# Patient Record
Sex: Male | Born: 1965
Health system: Southern US, Community
[De-identification: ages and names within clinical notes are randomized; demographics above are authoritative.]

## PROBLEM LIST (undated history)

## (undated) DIAGNOSIS — J029 Acute pharyngitis, unspecified: Secondary | ICD-10-CM

## (undated) DIAGNOSIS — M1990 Unspecified osteoarthritis, unspecified site: Secondary | ICD-10-CM

## (undated) DIAGNOSIS — K219 Gastro-esophageal reflux disease without esophagitis: Secondary | ICD-10-CM

## (undated) HISTORY — PX: NM PET  LYMPHOMA INITIAL: HXRAD81

## (undated) HISTORY — DX: Unspecified osteoarthritis, unspecified site: M19.90

---

## 2009-04-09 ENCOUNTER — Emergency Department (HOSPITAL_BASED_OUTPATIENT_CLINIC_OR_DEPARTMENT_OTHER): Admission: EM | Admit: 2009-04-09 | Discharge: 2009-04-09 | Payer: Self-pay | Admitting: Emergency Medicine

## 2011-11-15 ENCOUNTER — Encounter (HOSPITAL_BASED_OUTPATIENT_CLINIC_OR_DEPARTMENT_OTHER): Payer: Self-pay | Admitting: Emergency Medicine

## 2011-11-15 ENCOUNTER — Other Ambulatory Visit: Payer: Self-pay

## 2011-11-15 ENCOUNTER — Emergency Department (HOSPITAL_BASED_OUTPATIENT_CLINIC_OR_DEPARTMENT_OTHER)
Admission: EM | Admit: 2011-11-15 | Discharge: 2011-11-15 | Disposition: A | Discharge status: Home / Self Care | Payer: BC Managed Care – PPO | Attending: Emergency Medicine | Admitting: Emergency Medicine

## 2011-11-15 DIAGNOSIS — K219 Gastro-esophageal reflux disease without esophagitis: Secondary | ICD-10-CM | POA: Insufficient documentation

## 2011-11-15 DIAGNOSIS — F17200 Nicotine dependence, unspecified, uncomplicated: Secondary | ICD-10-CM | POA: Insufficient documentation

## 2011-11-15 DIAGNOSIS — G8929 Other chronic pain: Secondary | ICD-10-CM | POA: Insufficient documentation

## 2011-11-15 DIAGNOSIS — R109 Unspecified abdominal pain: Secondary | ICD-10-CM | POA: Insufficient documentation

## 2011-11-15 HISTORY — DX: Gastro-esophageal reflux disease without esophagitis: K21.9

## 2011-11-15 LAB — CBC
HCT: 38.5 % — ABNORMAL LOW (ref 39.0–52.0)
Platelets: 193 10*3/uL (ref 150–400)
RDW: 12.5 % (ref 11.5–15.5)
WBC: 9.3 10*3/uL (ref 4.0–10.5)

## 2011-11-15 LAB — TROPONIN I: Troponin I: <0.3 ng/mL (ref <0.30)

## 2011-11-15 MED ORDER — ESOMEPRAZOLE MAGNESIUM 40 MG PO CPDR: 40.0000 mg | DELAYED_RELEASE_CAPSULE | Freq: Every day | ORAL | Status: DC

## 2011-11-15 NOTE — ED Provider Notes (Addendum)
History     CSN: [id number]  Arrival date & time 11/15/11  2136   First MD Initiated Contact with Patient 11/15/11 2203      Chief Complaint  Patient presents with  . Abdominal Pain    (Consider location/radiation/quality/duration/timing/severity/associated sxs/prior treatment) HPI Complains of epigastric pain for 8 months, nonradiating, burning in quality, presently is mild is a 2 on a scale of 1-10, worse with eating, or drinking beer, or using spicy sauces. Presents today As has had pain all day and states he feels "tired for the past several days". No shortness of breath had no other associated symptoms no chest pain saw his primary care doctor several months ago treated with Nexium with relief of symptoms after one month switched to Zantac when he ran out of Nexium. Reports Zantac worked at first however no longer works well. Last bowel movement today normal. Patient's primary care doctor has advised him that he should receive endoscopy. Patient declines until now Past Medical History  Diagnosis Date  . GERD (gastroesophageal reflux disease)     History reviewed. No pertinent past surgical history.  No family history on file.  History  Substance Use Topics  . Smoking status: Current Everyday Smoker  . Smokeless tobacco: Not on file  . Alcohol Use: Yes      Review of Systems  Constitutional: Negative.   HENT: Negative.   Respiratory: Negative.   Cardiovascular: Negative.   Gastrointestinal: Positive for abdominal pain.  Musculoskeletal: Negative.   Skin: Negative.   Neurological: Negative.   Hematological: Negative.   Psychiatric/Behavioral: Negative.     Allergies  Review of patient's allergies indicates no known allergies.  Home Medications  No current outpatient prescriptions on file.  BP 136/87  Pulse 77  Temp(Src) 98 F (36.7 C) (Oral)  Resp 18  Ht 5\' 11"  (1.803 m)  Wt 214 lb (97.07 kg)  BMI 29.85 kg/m2  SpO2 99%  Physical Exam  Nursing note  and vitals reviewed. Abdominal: Bowel sounds are normal. He exhibits no distension and no mass. There is tenderness. There is no rebound and no guarding.       Tenderness at epigastrium  Genitourinary: Penis normal.    ED Course  Procedures (including critical care time)  Labs Reviewed - No data to display No results found.  Date: 11/15/2011  Rate: 65  Rhythm: normal sinus rhythm  QRS Axis: normal  Intervals: normal  ST/T Wave abnormalities: normal  Conduction Disutrbances:none  Narrative Interpretation:   Old EKG Reviewed: none available   No diagnosis found. Results for orders placed during the hospital encounter of 11/15/11  TROPONIN I      Component Value Range   Troponin I <0.30  <0.30 (ng/mL)  CBC      Component Value Range   WBC 9.3  4.0 - 10.5 (K/uL)   RBC 4.59  4.22 - 5.81 (MIL/uL)   Hemoglobin 14.2  13.0 - 17.0 (g/dL)   HCT 40.9 (*) 81.1 - 52.0 (%)   MCV 83.9  78.0 - 100.0 (fL)   MCH 30.9  26.0 - 34.0 (pg)   MCHC 36.9 (*) 30.0 - 36.0 (g/dL)   RDW 91.4  78.2 - 95.6 (%)   Platelets 193  150 - 400 (K/uL)   No results found.    MDM  Doubt cardiac etiology of symptoms ,. Lengthy discussion with patient that he should go back to his primary care Dr. for GI referral plan prescription for Nexium patient encouraged to stop alcohol  and smoking Diagnosis chronic abdominal pain        Doug Sou, MD 11/15/11 2325  Doug Sou, MD 12/06/11 1700

## 2011-11-15 NOTE — ED Notes (Signed)
Pt reports he received rx for nexium a few months ago and it initially helped. Pt has been taking zantac but "I'm tired of taking the pills"

## 2011-11-15 NOTE — ED Notes (Signed)
Pt c/o epigastric pain x 8 months. Denies n/v/d.

## 2013-02-07 ENCOUNTER — Encounter (HOSPITAL_BASED_OUTPATIENT_CLINIC_OR_DEPARTMENT_OTHER): Payer: Self-pay | Admitting: *Deleted

## 2013-02-07 ENCOUNTER — Emergency Department (HOSPITAL_BASED_OUTPATIENT_CLINIC_OR_DEPARTMENT_OTHER)
Admission: EM | Admit: 2013-02-07 | Discharge: 2013-02-07 | Disposition: A | Discharge status: Home / Self Care | Payer: BC Managed Care – PPO | Attending: Emergency Medicine | Admitting: Emergency Medicine

## 2013-02-07 DIAGNOSIS — K219 Gastro-esophageal reflux disease without esophagitis: Secondary | ICD-10-CM | POA: Insufficient documentation

## 2013-02-07 DIAGNOSIS — Y92410 Unspecified street and highway as the place of occurrence of the external cause: Secondary | ICD-10-CM | POA: Insufficient documentation

## 2013-02-07 DIAGNOSIS — S2095XA Superficial foreign body of unspecified parts of thorax, initial encounter: Secondary | ICD-10-CM | POA: Insufficient documentation

## 2013-02-07 DIAGNOSIS — R599 Enlarged lymph nodes, unspecified: Secondary | ICD-10-CM | POA: Insufficient documentation

## 2013-02-07 DIAGNOSIS — Y939 Activity, unspecified: Secondary | ICD-10-CM | POA: Insufficient documentation

## 2013-02-07 DIAGNOSIS — F17200 Nicotine dependence, unspecified, uncomplicated: Secondary | ICD-10-CM | POA: Insufficient documentation

## 2013-02-07 DIAGNOSIS — W57XXXA Bitten or stung by nonvenomous insect and other nonvenomous arthropods, initial encounter: Secondary | ICD-10-CM

## 2013-02-07 DIAGNOSIS — Z79899 Other long term (current) drug therapy: Secondary | ICD-10-CM | POA: Insufficient documentation

## 2013-02-07 MED ORDER — DOXYCYCLINE HYCLATE 100 MG PO CAPS: 100.0000 mg | ORAL_CAPSULE | Freq: Two times a day (BID) | ORAL | Status: DC

## 2013-02-07 NOTE — ED Provider Notes (Signed)
History     CSN: [id number]  Arrival date & time 02/07/13  1635   First MD Initiated Contact with Patient 02/07/13 1721      Chief Complaint  Patient presents with  . Fatigue    (Consider location/radiation/quality/duration/timing/severity/associated sxs/prior treatment) HPI  Patient with tick bite to right flank last week. He has noted some swelling in the groin and is sleepy. He has not had any fever or obvious rash. He denies any chest pain, cough, nausea, vomiting, or diarrhea. He has no previous medical problems.  Past Medical History  Diagnosis Date  . GERD (gastroesophageal reflux disease)     History reviewed. No pertinent past surgical history.  No family history on file.  History  Substance Use Topics  . Smoking status: Current Every Day Smoker -- 0.50 packs/day    Types: Cigarettes  . Smokeless tobacco: Not on file  . Alcohol Use: Yes      Review of Systems  All other systems reviewed and are negative.    Allergies  Review of patient's allergies indicates no known allergies.  Home Medications   Current Outpatient Rx  Name  Route  Sig  Dispense  Refill  . EXPIRED: esomeprazole (NEXIUM) 40 MG capsule   Oral   Take 1 capsule (40 mg total) by mouth daily.   30 capsule   0     BP 133/80  Pulse 87  Temp(Src) 98.8 F (37.1 C) (Oral)  Resp 18  Ht 5\' 11"  (1.803 m)  Wt 210 lb (95.255 kg)  BMI 29.3 kg/m2  SpO2 100%  Physical Exam  Nursing note and vitals reviewed. Constitutional: He is oriented to person, place, and time. He appears well-developed and well-nourished.  HENT:  Head: Normocephalic and atraumatic.  Right Ear: External ear normal.  Left Ear: External ear normal.  Nose: Nose normal.  Mouth/Throat: Oropharynx is clear and moist.  Eyes: Conjunctivae are normal. Pupils are equal, round, and reactive to light.  Neck: Normal range of motion. Neck supple.  Cardiovascular: Normal rate, regular rhythm, normal heart sounds and intact  distal pulses.   Pulmonary/Chest: Effort normal and breath sounds normal.  Abdominal: Soft. Bowel sounds are normal.  Musculoskeletal:  Right pelvic adenopathy palpated. Mildly tender.  Neurological: He is alert and oriented to person, place, and time. He has normal reflexes.  Skin: Skin is warm and dry. No rash noted.  Psychiatric: He has a normal mood and affect. His behavior is normal. Judgment and thought content normal.    ED Course  Procedures (including critical care time)  Labs Reviewed - No data to display No results found.   1. Tick bite   2. Swelling, lymph nodes       MDM      As the diagnosis of RMSF can rarely be confirmed or disproved in its early phases, the cornerstone of management is empiric therapy based upon clinical judgment and the epidemiologic setting.    Hilario Quarry, MD 02/07/13 [phone]

## 2013-02-07 NOTE — ED Notes (Signed)
Fatigue, aching in his joints. Removed 2 ticks from his back last week.

## 2013-02-07 NOTE — ED Notes (Signed)
Pt. Drove self here.

## 2013-02-07 NOTE — ED Notes (Signed)
Care assumed

## 2015-09-22 ENCOUNTER — Encounter (HOSPITAL_BASED_OUTPATIENT_CLINIC_OR_DEPARTMENT_OTHER): Payer: Self-pay | Admitting: Emergency Medicine

## 2015-09-22 ENCOUNTER — Emergency Department (HOSPITAL_BASED_OUTPATIENT_CLINIC_OR_DEPARTMENT_OTHER)
Admission: EM | Admit: 2015-09-22 | Discharge: 2015-09-22 | Disposition: A | Discharge status: Home / Self Care | Payer: 59 | Attending: Emergency Medicine | Admitting: Emergency Medicine

## 2015-09-22 DIAGNOSIS — K219 Gastro-esophageal reflux disease without esophagitis: Secondary | ICD-10-CM | POA: Diagnosis not present

## 2015-09-22 DIAGNOSIS — R51 Headache: Secondary | ICD-10-CM | POA: Insufficient documentation

## 2015-09-22 DIAGNOSIS — R070 Pain in throat: Secondary | ICD-10-CM | POA: Diagnosis not present

## 2015-09-22 DIAGNOSIS — Z87891 Personal history of nicotine dependence: Secondary | ICD-10-CM | POA: Insufficient documentation

## 2015-09-22 DIAGNOSIS — M542 Cervicalgia: Secondary | ICD-10-CM | POA: Insufficient documentation

## 2015-09-22 DIAGNOSIS — Z792 Long term (current) use of antibiotics: Secondary | ICD-10-CM | POA: Insufficient documentation

## 2015-09-22 DIAGNOSIS — J029 Acute pharyngitis, unspecified: Secondary | ICD-10-CM | POA: Diagnosis present

## 2015-09-22 HISTORY — DX: Acute pharyngitis, unspecified: J02.9

## 2015-09-22 MED ORDER — NAPROXEN 500 MG PO TABS: 500.0000 mg | ORAL_TABLET | Freq: Two times a day (BID) | ORAL | Status: DC

## 2015-09-22 MED ORDER — PENICILLIN V POTASSIUM 500 MG PO TABS: 500.0000 mg | ORAL_TABLET | Freq: Four times a day (QID) | ORAL | Status: DC

## 2015-09-22 NOTE — ED Notes (Signed)
Pt has had recurring throat problems for over a year.  Pain is in throat and also involves left ear and left side of face. Has recently taken amoxicillin and states he was told that if it did not go away he would need a shot.  Sts he also has an abscessed tooth on left.

## 2015-09-22 NOTE — ED Provider Notes (Signed)
CSN: [id number]     Arrival date & time 09/22/15  1557 History   First MD Initiated Contact with Patient 09/22/15 1606     Chief Complaint  Patient presents with  . Sore Throat     (Consider location/radiation/quality/duration/timing/severity/associated sxs/prior Treatment) Patient is a 49 y.o. male presenting with pharyngitis. The history is provided by the patient.  Sore Throat Associated symptoms include headaches. Pertinent negatives include no chest pain, no abdominal pain and no shortness of breath.   patient with several week history of left-sided throat pain. Evaluated by his dentist your nose and throat and primary care provider. Primary care provider did a rapid strep that was negative started him on amoxicillin was some improvement but not complete resolution. Now things are getting worse again. Also had thorough evaluation for concerns for throat cancer by ear nose and throat Dr. Constance Holster workup was negative other than some evidence of reflux disease. Today patient with increased pain. Patient known to have a bad tooth left lower molar on that side but not acting exactly like a toothache. Patient has pain on that side of the throat painful to swallow painful to eat and the pain radiates into the left ear. The tooth on that side is planned to be capped. Patient did contact dentist today they wanted to see him evaluation thinking that maybe the tooth gone bad.  Past Medical History  Diagnosis Date  . GERD (gastroesophageal reflux disease)   . Throat infection    Past Surgical History  Procedure Laterality Date  . Nm pet  lymphoma initial     No family history on file. Social History  Substance Use Topics  . Smoking status: Former Smoker -- 0.50 packs/day    Types: Cigarettes  . Smokeless tobacco: None  . Alcohol Use: 2.4 oz/week    4 Glasses of wine per week    Review of Systems  Constitutional: Negative for fever.  HENT: Positive for ear pain, sore throat and trouble  swallowing. Negative for congestion, facial swelling and voice change.   Eyes: Negative for redness.  Respiratory: Negative for shortness of breath.   Cardiovascular: Negative for chest pain.  Gastrointestinal: Negative for abdominal pain.  Musculoskeletal: Positive for neck pain.  Skin: Negative for rash.  Neurological: Positive for headaches. Negative for speech difficulty.  Hematological: Negative for adenopathy.  Psychiatric/Behavioral: Negative for confusion.      Allergies  Review of patient's allergies indicates no known allergies.  Home Medications   Prior to Admission medications   Medication Sig Start Date End Date Taking? Authorizing Provider  doxycycline (VIBRAMYCIN) 100 MG capsule Take 1 capsule (100 mg total) by mouth 2 (two) times daily. 02/07/13   Pattricia Boss, MD  esomeprazole (NEXIUM) 40 MG capsule Take 1 capsule (40 mg total) by mouth daily. 11/15/11 11/14/12  Orlie Dakin, MD  naproxen (NAPROSYN) 500 MG tablet Take 1 tablet (500 mg total) by mouth 2 (two) times daily. 09/22/15   Fredia Sorrow, MD  penicillin v potassium (VEETID) 500 MG tablet Take 1 tablet (500 mg total) by mouth 4 (four) times daily. 09/22/15   Fredia Sorrow, MD   BP 152/99 mmHg  Pulse 84  Temp(Src) 98.5 F (36.9 C) (Oral)  Resp 16  Ht 5\' 11"  (1.803 m)  Wt 89.359 kg  BMI 27.49 kg/m2  SpO2 98% Physical Exam  Constitutional: He is oriented to person, place, and time. He appears well-developed and well-nourished. No distress.  HENT:  Head: Normocephalic and atraumatic.  Left Ear:  External ear normal.  Mouth/Throat: Oropharynx is clear and moist. No oropharyngeal exudate.  Eyes: Conjunctivae and EOM are normal. Pupils are equal, round, and reactive to light.  Neck: Normal range of motion. Neck supple.  Cardiovascular: Normal rate, regular rhythm and normal heart sounds.   No murmur heard. Pulmonary/Chest: Effort normal and breath sounds normal. No stridor. No respiratory distress.   Abdominal: Soft. Bowel sounds are normal. There is no tenderness.  Musculoskeletal: Normal range of motion.  Lymphadenopathy:    He has no cervical adenopathy.  Neurological: He is alert and oriented to person, place, and time. No cranial nerve deficit. He exhibits normal muscle tone. Coordination normal.  Skin: Skin is warm. No erythema.  Nursing note and vitals reviewed.   ED Course  Procedures (including critical care time) Labs Review Labs Reviewed - No data to display  Imaging Review No results found. I have personally reviewed and evaluated these images and lab results as part of my medical decision-making.   EKG Interpretation None      MDM   Final diagnoses:  Throat pain    Patient with left-sided throat pain now for several weeks. Known to have a bad tooth left lower molar. Patient with symptoms in that area but not classic for throbbing tooth pain. Also patient's had evaluation by ear nose and throat of the area without any specific findings that seem to be related. It did show evidence of some reflux disease problems but not sure whether that was causing the pain. Also patient did have a course of amoxicillin by his primary care doctor which improved things not completely but then it started to get worse again here today. Patient's dentist did want to see him today with patient came in here. Offered CT soft tissue neck patient does not want that offered repeating rapid strep test. Very possible this may be related to a tooth. Will start patient on penicillin him follow-up with his dentist. And anti-inflammatories. Patient does have the option to follow back up with ear nose and throat are equal and return here we can do the soft tissue CT of the neck.  Patient nontoxic no acute distress.    Fredia Sorrow, MD 09/22/15 [phone]

## 2015-09-22 NOTE — ED Notes (Signed)
MD at bedside. 

## 2015-09-22 NOTE — Discharge Instructions (Signed)
Follow-up with your dentist for further evaluation of that left lower tooth. It may be the culprit of the pain over the past several weeks. Return for any new or worse symptoms. If dentist does not have an answer would recommend following back up with ear nose and throat specialist. As we discussed you can return here and we could do a CT soft tissue of the neck for further evaluation.

## 2017-09-12 ENCOUNTER — Encounter: Payer: Self-pay | Admitting: Family Medicine

## 2017-09-12 ENCOUNTER — Ambulatory Visit (INDEPENDENT_AMBULATORY_CARE_PROVIDER_SITE_OTHER): Payer: 59

## 2017-09-12 ENCOUNTER — Ambulatory Visit: Payer: 59 | Admitting: Family Medicine

## 2017-09-12 VITALS — BP 130/80 | HR 75 | Temp 97.7°F | Ht 71.0 in | Wt 198.0 lb

## 2017-09-12 DIAGNOSIS — L700 Acne vulgaris: Secondary | ICD-10-CM

## 2017-09-12 DIAGNOSIS — R05 Cough: Secondary | ICD-10-CM

## 2017-09-12 DIAGNOSIS — R059 Cough, unspecified: Secondary | ICD-10-CM

## 2017-09-12 DIAGNOSIS — J40 Bronchitis, not specified as acute or chronic: Secondary | ICD-10-CM | POA: Diagnosis not present

## 2017-09-12 DIAGNOSIS — J329 Chronic sinusitis, unspecified: Secondary | ICD-10-CM

## 2017-09-12 DIAGNOSIS — Z0000 Encounter for general adult medical examination without abnormal findings: Secondary | ICD-10-CM

## 2017-09-12 LAB — URINALYSIS
BILIRUBIN UA: NEGATIVE
GLUCOSE, UA: NEGATIVE
LEUKOCYTES UA: NEGATIVE
Nitrite, UA: NEGATIVE
PROTEIN UA: NEGATIVE
RBC, UA: NEGATIVE
Specific Gravity, UA: 1.01 (ref 1.005–1.030)
Urobilinogen, Ur: 0.2 mg/dL (ref 0.2–1.0)
pH, UA: 5.5 (ref 5.0–7.5)

## 2017-09-12 MED ORDER — SILDENAFIL CITRATE 20 MG PO TABS: ORAL_TABLET | ORAL | 5 refills | Status: DC

## 2017-09-12 MED ORDER — LEVOFLOXACIN 500 MG PO TABS: 500.0000 mg | ORAL_TABLET | Freq: Every day | ORAL | 0 refills | Status: DC

## 2017-09-12 MED ORDER — TRETINOIN 0.025 % EX CREA: TOPICAL_CREAM | Freq: Every day | CUTANEOUS | 10 refills | Status: DC

## 2017-09-12 MED ORDER — BETAMETHASONE SOD PHOS & ACET 6 (3-3) MG/ML IJ SUSP
6.0000 mg | Freq: Once | INTRAMUSCULAR | Status: AC
  Administered 2017-09-12: 6 mg via INTRAMUSCULAR

## 2017-09-12 NOTE — Progress Notes (Signed)
Chief Complaint  Patient presents with  . New Patient (Initial Visit)    pt here today to establish care and also c/o sinus congestion    HPI  Patient presents today for Patient presents with upper respiratory congestion. Rhinorrhea that is frequently purulent. There is moderate sore throat. Patient reports coughing frequently as well.  Yellow sputum noted. There is no fever, chills or sweats. The patient denies being short of breath. Onset was 3-5 days ago. Gradually worsening. Tried OTCs without improvement.  PMH: Smoking status noted ROS: Review of Systems  Constitutional: Positive for fatigue (Patient is always been very active.  He is concerned that perhaps his testosterone has dropped.  He does have some ED issues.  He would like to have some sildenafil.). Negative for activity change, appetite change, chills, diaphoresis, fever and unexpected weight change.  HENT: Negative for congestion, ear pain, hearing loss, postnasal drip, rhinorrhea, sore throat, tinnitus and trouble swallowing.   Eyes: Negative for photophobia, pain, discharge and redness.  Respiratory: Negative for apnea, cough, choking, chest tightness, shortness of breath, wheezing and stridor.   Cardiovascular: Negative for chest pain, palpitations and leg swelling.  Gastrointestinal: Negative for abdominal distention, abdominal pain, blood in stool, constipation, diarrhea, nausea and vomiting.  Endocrine: Negative for cold intolerance, heat intolerance, polydipsia, polyphagia and polyuria.  Genitourinary: Negative for difficulty urinating, dysuria, enuresis, flank pain, frequency, genital sores, hematuria and urgency.       Patient concerned about ED.  He would like to have sildenafil prescribed.  Concerned about cost.  Additionally he is concerned that he also has a lack of energy at times and would like to have testosterone checked.  He is aware that other things can cause these problems and is willing to have those checked  as well including thyroid and blood counts.  Musculoskeletal: Negative for arthralgias and joint swelling.  Skin: Negative for color change, rash and wound.  Allergic/Immunologic: Negative for immunocompromised state.  Neurological: Negative for dizziness, tremors, seizures, syncope, facial asymmetry, speech difficulty, weakness, light-headedness, numbness and headaches.  Hematological: Does not bruise/bleed easily.  Psychiatric/Behavioral: Negative for agitation, behavioral problems, confusion, decreased concentration, dysphoric mood, hallucinations, sleep disturbance and suicidal ideas. The patient is not nervous/anxious and is not hyperactive.     Objective: BP 130/80   Pulse 75   Temp 97.7 F (36.5 C) (Oral)   Ht 5' 11"  (1.803 m)   Wt 198 lb (89.8 kg)   BMI 27.62 kg/m  Physical Exam  Constitutional: He is oriented to person, place, and time. He appears well-developed and well-nourished.  HENT:  Head: Normocephalic and atraumatic.  Right Ear: Tympanic membrane and external ear normal. No decreased hearing is noted.  Left Ear: Tympanic membrane and external ear normal. No decreased hearing is noted.  Nose: Right sinus exhibits maxillary sinus tenderness. Right sinus exhibits no frontal sinus tenderness. Left sinus exhibits no frontal sinus tenderness.  Mouth/Throat: Oropharynx is clear and moist. No oropharyngeal exudate or posterior oropharyngeal erythema.  Eyes: Conjunctivae and EOM are normal. Pupils are equal, round, and reactive to light. Right eye exhibits no discharge. Left eye exhibits no discharge.  Neck: Normal range of motion. Neck supple. No JVD present. No Brudzinski's sign noted. No thyromegaly present.  Cardiovascular: Normal rate, regular rhythm and normal heart sounds. Exam reveals no gallop and no friction rub.  No murmur heard. Pulmonary/Chest: Effort normal. No stridor. No respiratory distress. He has wheezes in the right middle field, the right lower field, the  left  upper field, the left middle field and the left lower field. He has rhonchi. He exhibits no tenderness.  Abdominal: Soft. Bowel sounds are normal. He exhibits no distension and no mass. There is no tenderness. There is no rebound and no guarding. No hernia.  Genitourinary: Penis normal.  Musculoskeletal: Normal range of motion. He exhibits no edema.  Noted to have muscular physique.  Out of character for age  Lymphadenopathy:       Head (right side): No preauricular adenopathy present.       Head (left side): No preauricular adenopathy present.    He has no cervical adenopathy.       Right cervical: No superficial cervical adenopathy present.      Left cervical: No superficial cervical adenopathy present.  Neurological: He is alert and oriented to person, place, and time. He displays normal reflexes. No cranial nerve deficit. He exhibits normal muscle tone. Coordination normal.  Skin: Skin is warm and dry. No rash noted. No erythema.  Psychiatric: He has a normal mood and affect. His behavior is normal. Judgment and thought content normal.  Vitals reviewed.   Assessment and plan:  1. Well adult exam   2. Sinobronchitis   3. Cough   4. Open comedone     Meds ordered this encounter  Medications  . betamethasone acetate-betamethasone sodium phosphate (CELESTONE) injection 6 mg  . levofloxacin (LEVAQUIN) 500 MG tablet    Sig: Take 1 tablet (500 mg total) by mouth daily.    Dispense:  10 tablet    Refill:  0  . DISCONTD: sildenafil (REVATIO) 20 MG tablet    Sig: Take 2 tablets by mouth daily as needed.    Dispense:  50 tablet    Refill:  5  . tretinoin (RETIN-A) 0.025 % cream    Sig: Apply topically at bedtime.    Dispense:  45 g    Refill:  10  . sildenafil (REVATIO) 20 MG tablet    Sig: Take 2 tablets by mouth daily as needed.    Dispense:  50 tablet    Refill:  5    Orders Placed This Encounter  Procedures  . DG Chest 2 View    Standing Status:   Future    Number  of Occurrences:   1    Standing Expiration Date:   11/12/2018    Order Specific Question:   Reason for Exam (SYMPTOM  OR DIAGNOSIS REQUIRED)    Answer:   cough    Order Specific Question:   Preferred imaging location?    Answer:   Internal  . CBC with Differential/Platelet  . CMP14+EGFR  . PSA, total and free  . Testosterone,Free and Total  . Urinalysis    Follow up as needed.  Claretta Fraise, MD

## 2017-09-13 ENCOUNTER — Telehealth: Payer: Self-pay | Admitting: Family Medicine

## 2017-09-13 LAB — CBC WITH DIFFERENTIAL/PLATELET
BASOS ABS: 0 10*3/uL (ref 0.0–0.2)
Basos: 0 %
EOS (ABSOLUTE): 0.2 10*3/uL (ref 0.0–0.4)
Eos: 2 %
HEMOGLOBIN: 15.5 g/dL (ref 13.0–17.7)
Hematocrit: 44 % (ref 37.5–51.0)
IMMATURE GRANS (ABS): 0 10*3/uL (ref 0.0–0.1)
Immature Granulocytes: 0 %
LYMPHS ABS: 2.7 10*3/uL (ref 0.7–3.1)
LYMPHS: 26 %
MCH: 31.9 pg (ref 26.6–33.0)
MCHC: 35.2 g/dL (ref 31.5–35.7)
MCV: 91 fL (ref 79–97)
MONOCYTES: 7 %
Monocytes Absolute: 0.7 10*3/uL (ref 0.1–0.9)
Neutrophils Absolute: 7 10*3/uL (ref 1.4–7.0)
Neutrophils: 65 %
PLATELETS: 253 10*3/uL (ref 150–379)
RBC: 4.86 x10E6/uL (ref 4.14–5.80)
RDW: 13.5 % (ref 12.3–15.4)
WBC: 10.6 10*3/uL (ref 3.4–10.8)

## 2017-09-13 LAB — CMP14+EGFR
ALBUMIN: 4.6 g/dL (ref 3.5–5.5)
ALK PHOS: 69 IU/L (ref 39–117)
ALT: 17 IU/L (ref 0–44)
AST: 21 IU/L (ref 0–40)
Albumin/Globulin Ratio: 2 (ref 1.2–2.2)
BILIRUBIN TOTAL: 0.3 mg/dL (ref 0.0–1.2)
BUN / CREAT RATIO: 15 (ref 9–20)
BUN: 14 mg/dL (ref 6–24)
CHLORIDE: 101 mmol/L (ref 96–106)
CO2: 24 mmol/L (ref 20–29)
CREATININE: 0.96 mg/dL (ref 0.76–1.27)
Calcium: 9.3 mg/dL (ref 8.7–10.2)
GFR calc Af Amer: 106 mL/min/{1.73_m2} (ref >59)
GFR calc non Af Amer: 92 mL/min/{1.73_m2} (ref >59)
Globulin, Total: 2.3 g/dL (ref 1.5–4.5)
Glucose: 88 mg/dL (ref 65–99)
Potassium: 4.1 mmol/L (ref 3.5–5.2)
Sodium: 142 mmol/L (ref 134–144)
Total Protein: 6.9 g/dL (ref 6.0–8.5)

## 2017-09-13 LAB — PSA, TOTAL AND FREE
PROSTATE SPECIFIC AG, SERUM: 1.7 ng/mL (ref 0.0–4.0)
PSA FREE: 0.21 ng/mL
PSA, Free Pct: 12.4 %

## 2017-09-13 LAB — TESTOSTERONE,FREE AND TOTAL
TESTOSTERONE: 532 ng/dL (ref 264–916)
Testosterone, Free: 11.1 pg/mL (ref 7.2–24.0)

## 2017-09-13 NOTE — Telephone Encounter (Signed)
Thanks for the feedback! WS

## 2017-09-13 NOTE — Telephone Encounter (Signed)
Patient aware of results.  FYI He would like for the provider to know , he was very impressed with him.  Thanks to him for being thorough and detail oriented.

## 2017-09-15 LAB — SPECIMEN STATUS REPORT

## 2017-09-15 LAB — VITAMIN D 25 HYDROXY (VIT D DEFICIENCY, FRACTURES): VIT D 25 HYDROXY: 37.1 ng/mL (ref 30.0–100.0)

## 2018-03-12 ENCOUNTER — Ambulatory Visit: Payer: 59 | Admitting: Family Medicine

## 2018-03-13 ENCOUNTER — Encounter: Payer: Self-pay | Admitting: Family Medicine

## 2018-05-29 ENCOUNTER — Encounter: Payer: Self-pay | Admitting: Family Medicine

## 2018-05-29 ENCOUNTER — Ambulatory Visit (INDEPENDENT_AMBULATORY_CARE_PROVIDER_SITE_OTHER): Payer: 59 | Admitting: Family Medicine

## 2018-05-29 VITALS — BP 130/72 | HR 75 | Temp 98.9°F | Ht 71.0 in | Wt 189.4 lb

## 2018-05-29 DIAGNOSIS — Z1211 Encounter for screening for malignant neoplasm of colon: Secondary | ICD-10-CM

## 2018-05-29 DIAGNOSIS — R072 Precordial pain: Secondary | ICD-10-CM

## 2018-05-31 ENCOUNTER — Encounter: Payer: Self-pay | Admitting: Gastroenterology

## 2018-06-02 ENCOUNTER — Encounter: Payer: Self-pay | Admitting: Family Medicine

## 2018-06-02 NOTE — Progress Notes (Signed)
Subjective:  Patient ID: Edward Norman, male    DOB: [birth year]  Age: 52 y.o. MRN: [id number]  CC: Medical Management of Chronic Issues   HPI Edward Norman presents for annual checkup. No partner so he doesn't need viagra. No other meds needed. Has some left sided chest heaviness several weeks ago. Lasted about an hour. Moderate intensity.  It has not recurred. No association with exertion, dyspnea or radiation.   Depression screen [facility] 2/9 05/29/2018 09/12/2017  Decreased Interest 0 0  Down, Depressed, Hopeless 0 0  PHQ - 2 Score 0 0    History Edward Norman has a past medical history of Arthritis, GERD (gastroesophageal reflux disease), and Throat infection.   He has a past surgical history that includes NM PET  LYMPHOMA INITIAL.   His family history includes Asthma in his daughter; Cancer in his maternal grandfather.He reports that he has been smoking cigarettes. He has been smoking about 0.50 packs per day. He has never used smokeless tobacco. He reports that he drinks about 4.0 standard drinks of alcohol per week. He reports that he does not use drugs.    ROS Review of Systems  Constitutional: Negative.   HENT: Negative.   Eyes: Negative for visual disturbance.  Respiratory: Negative for cough and shortness of breath.   Cardiovascular: Negative for chest pain and leg swelling.  Gastrointestinal: Negative for abdominal pain, diarrhea, nausea and vomiting.  Genitourinary: Negative for difficulty urinating.  Musculoskeletal: Negative for arthralgias and myalgias.  Skin: Negative for rash.  Neurological: Negative for headaches.  Psychiatric/Behavioral: Negative for sleep disturbance.    Objective:  BP 130/72   Pulse 75   Temp 98.9 F (37.2 C) (Oral)   Ht 5\' 11"  (1.803 m)   Wt 189 lb 6 oz (85.9 kg)   BMI 26.41 kg/m   BP Readings from Last 3 Encounters:  05/29/18 130/72  09/12/17 130/80  09/22/15 152/99    Wt Readings from Last 3 Encounters:    05/29/18 189 lb 6 oz (85.9 kg)  09/12/17 198 lb (89.8 kg)  09/22/15 197 lb (89.4 kg)     Physical Exam  Constitutional: He is oriented to person, place, and time. He appears well-developed and well-nourished. No distress.  HENT:  Head: Normocephalic and atraumatic.  Right Ear: External ear normal.  Left Ear: External ear normal.  Nose: Nose normal.  Mouth/Throat: Oropharynx is clear and moist.  Eyes: Pupils are equal, round, and reactive to light. Conjunctivae and EOM are normal.  Neck: Normal range of motion. Neck supple.  Cardiovascular: Normal rate, regular rhythm and normal heart sounds.  No murmur heard. Pulmonary/Chest: Effort normal and breath sounds normal. No respiratory distress. He has no wheezes. He has no rales.  Abdominal: Soft. There is no tenderness.  Musculoskeletal: Normal range of motion.  Neurological: He is alert and oriented to person, place, and time. He has normal reflexes.  Skin: Skin is warm and dry.  Psychiatric: He has a normal mood and affect. His behavior is normal. Judgment and thought content normal.      Assessment & Plan:   Galvin was seen today for medical management of chronic issues.  Diagnoses and all orders for this visit:  Screening for colon cancer -     Ambulatory referral to Gastroenterology  Precordial pain       I have discontinued Harrell Gave Asberry's levofloxacin and tretinoin. I am also having him maintain his sildenafil, sildenafil, and albuterol.  Allergies as of 05/29/2018   No Known Allergies  Medication List        Accurate as of 05/29/18 11:59 PM. Always use your most recent med list.          PROVENTIL HFA 108 (90 Base) MCG/ACT inhaler Generic drug:  albuterol Inhale into the lungs.   sildenafil 100 MG tablet Commonly known as:  VIAGRA Take 100 mg by mouth.   sildenafil 20 MG tablet Commonly known as:  REVATIO Take 2 tablets by mouth daily as needed.        Follow-up: Return in  about 1 year (around 05/30/2019), or if symptoms worsen or fail to improve.  Claretta Fraise, M.D.

## 2018-07-29 ENCOUNTER — Encounter: Payer: 59 | Admitting: Gastroenterology

## 2019-01-10 ENCOUNTER — Other Ambulatory Visit: Payer: Self-pay | Admitting: Family Medicine

## 2019-02-13 ENCOUNTER — Ambulatory Visit (INDEPENDENT_AMBULATORY_CARE_PROVIDER_SITE_OTHER): Payer: 59 | Admitting: Family Medicine

## 2019-02-13 ENCOUNTER — Encounter: Payer: Self-pay | Admitting: Family Medicine

## 2019-02-13 ENCOUNTER — Other Ambulatory Visit: Payer: Self-pay

## 2019-02-13 DIAGNOSIS — K5792 Diverticulitis of intestine, part unspecified, without perforation or abscess without bleeding: Secondary | ICD-10-CM

## 2019-02-13 MED ORDER — CIPROFLOXACIN HCL 500 MG PO TABS: 500.0000 mg | ORAL_TABLET | Freq: Two times a day (BID) | ORAL | 0 refills | Status: DC

## 2019-02-13 MED ORDER — METRONIDAZOLE 500 MG PO TABS: 500.0000 mg | ORAL_TABLET | Freq: Two times a day (BID) | ORAL | 0 refills | Status: DC

## 2019-02-13 NOTE — Progress Notes (Signed)
Subjective:    Patient ID: Edward Norman, male    DOB: November 28, 1965, 53 y.o.   MRN: [id number]   HPI: Edward Norman is a 53 y.o. male presenting for pain between umbilicus and pelvis. Occurred a week ago for 2 days. Recurred for the last 4 days. Feels like a gas cramp. On the left side.  "cutting across." Denies NVD.Started out 7-8/10. Now at 2-3/10 .   Depression screen [facility] 2/9 05/29/2018 09/12/2017  Decreased Interest 0 0  Down, Depressed, Hopeless 0 0  PHQ - 2 Score 0 0     Relevant past medical, surgical, family and social history reviewed and updated as indicated.  Interim medical history since our last visit reviewed. Allergies and medications reviewed and updated.  ROS:  Review of Systems  Constitutional: Negative for chills, diaphoresis, fever and unexpected weight change.  HENT: Negative for rhinorrhea and trouble swallowing.   Respiratory: Negative for cough, chest tightness and shortness of breath.   Cardiovascular: Negative for chest pain.  Gastrointestinal: Positive for abdominal distention and abdominal pain. Negative for blood in stool, constipation, diarrhea, nausea, rectal pain and vomiting.  Genitourinary: Negative for dysuria, flank pain and hematuria.  Musculoskeletal: Negative for arthralgias and joint swelling.  Skin: Negative for rash.  Neurological: Negative for syncope and headaches.     Social History   Tobacco Use  Smoking Status Current Every Day Smoker  . Packs/day: 0.50  . Types: Cigarettes  Smokeless Tobacco Never Used       Objective:     Wt Readings from Last 3 Encounters:  05/29/18 189 lb 6 oz (85.9 kg)  09/12/17 198 lb (89.8 kg)  09/22/15 197 lb (89.4 kg)     Exam deferred. Pt. Harboring due to COVID 19. Phone visit performed.   Assessment & Plan:   1. Diverticulitis     Meds ordered this encounter  Medications  . ciprofloxacin (CIPRO) 500 MG tablet    Sig: Take 1 tablet (500 mg total) by mouth 2 (two)  times daily.    Dispense:  14 tablet    Refill:  0  . metroNIDAZOLE (FLAGYL) 500 MG tablet    Sig: Take 1 tablet (500 mg total) by mouth 2 (two) times daily.    Dispense:  14 tablet    Refill:  0    No orders of the defined types were placed in this encounter.     Diagnoses and all orders for this visit:  Diverticulitis  Other orders -     ciprofloxacin (CIPRO) 500 MG tablet; Take 1 tablet (500 mg total) by mouth 2 (two) times daily. -     metroNIDAZOLE (FLAGYL) 500 MG tablet; Take 1 tablet (500 mg total) by mouth 2 (two) times daily.    Virtual Visit via telephone Note  I discussed the limitations, risks, security and privacy concerns of performing an evaluation and management service by telephone and the availability of in person appointments. The patient was identified with two identifiers. Pt.expressed understanding and agreed to proceed. Pt. Is at home. Dr. Livia Snellen is in his office.  Follow Up Instructions:   I discussed the assessment and treatment plan with the patient. The patient was provided an opportunity to ask questions and all were answered. The patient agreed with the plan and demonstrated an understanding of the instructions.   The patient was advised to call back or seek an in-person evaluation if the symptoms worsen or if the condition fails to improve as anticipated.  Visit  started: 1:42 Call ended:  1:56 Total minutes including chart review and phone contact time: 18   Follow up plan: Return if symptoms worsen or fail to improve.  Claretta Fraise, MD [place]

## 2019-04-25 IMAGING — DX DG CHEST 2V
2 series · 2 of 2 positions shown · non-contrast
Comparison: None.

CLINICAL DATA: Smoker cough

EXAM:
CHEST  2 VIEW

[chest pa]
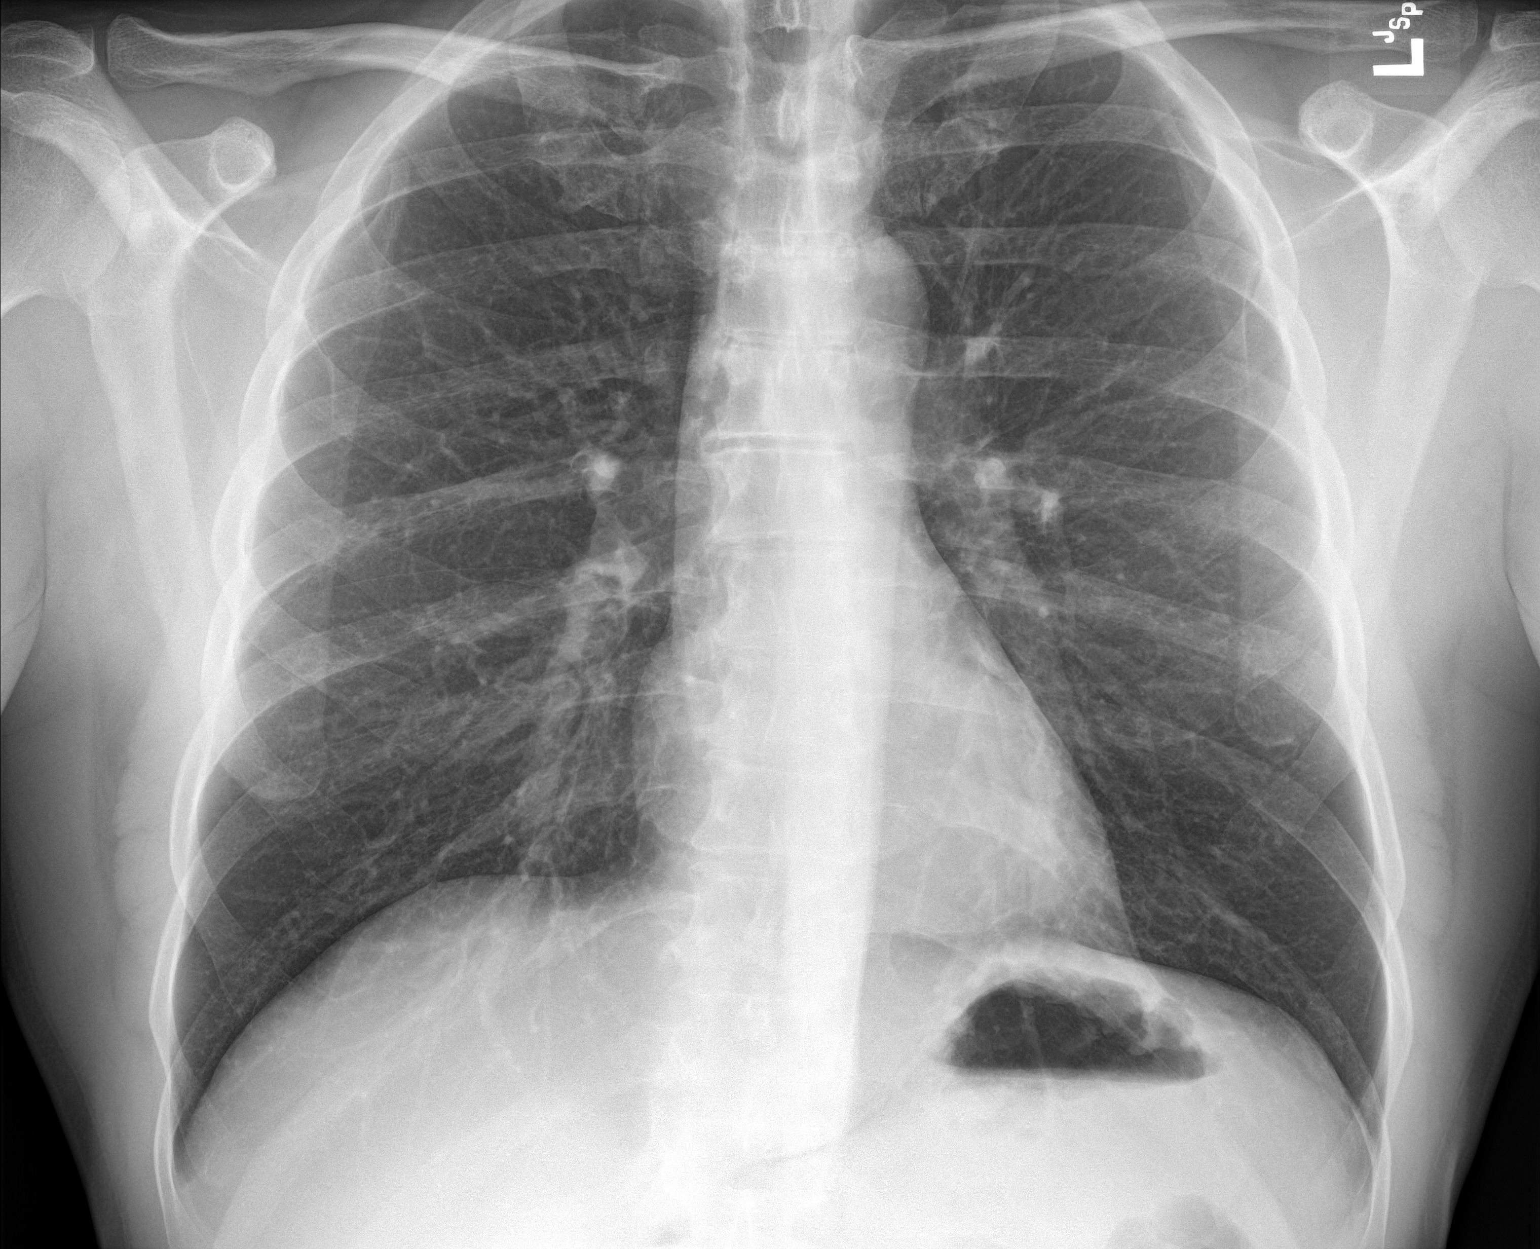

[chest lat]
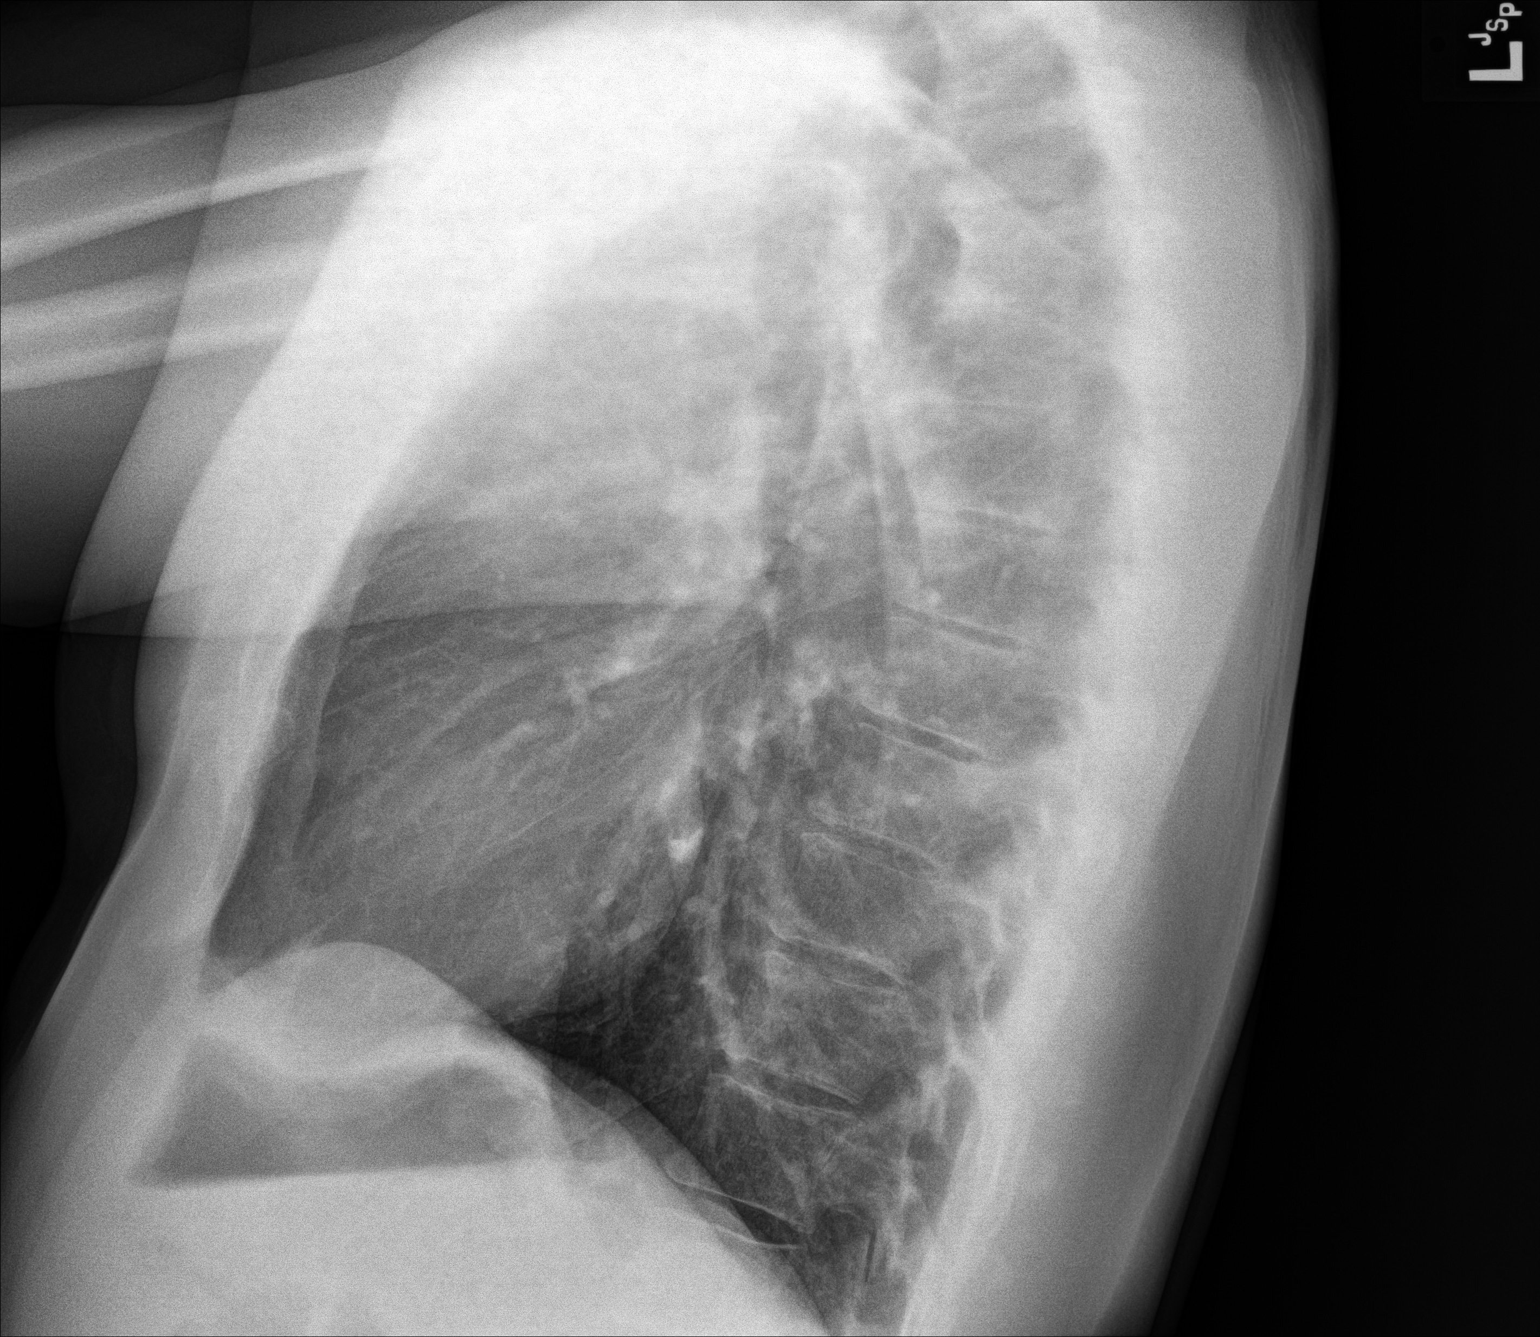

[2 of 2 positions shown; findings below may reference images not displayed]

FINDINGS: Hyperinflation. No acute infiltrate or effusion. Normal heart size.
No pneumothorax.
IMPRESSION: No active cardiopulmonary disease.

## 2019-10-06 ENCOUNTER — Telehealth: Payer: Self-pay | Admitting: Family Medicine

## 2019-10-06 NOTE — Telephone Encounter (Signed)
Patient has been having discomfort with urination ever since last week after having sex.  Appointment scheduled 10/07/2019 at 4:25 pm with Dr. Livia Snellen.

## 2019-10-07 ENCOUNTER — Ambulatory Visit (INDEPENDENT_AMBULATORY_CARE_PROVIDER_SITE_OTHER): Payer: PRIVATE HEALTH INSURANCE | Admitting: Family Medicine

## 2019-10-07 ENCOUNTER — Encounter: Payer: Self-pay | Admitting: Family Medicine

## 2019-10-07 ENCOUNTER — Other Ambulatory Visit: Payer: Self-pay

## 2019-10-07 VITALS — BP 175/126 | HR 74 | Temp 97.8°F | Ht 71.0 in | Wt 201.0 lb

## 2019-10-07 DIAGNOSIS — N410 Acute prostatitis: Secondary | ICD-10-CM | POA: Diagnosis not present

## 2019-10-07 LAB — URINALYSIS, COMPLETE
Bilirubin, UA: NEGATIVE
Glucose, UA: NEGATIVE
Ketones, UA: NEGATIVE
Nitrite, UA: NEGATIVE
Protein,UA: NEGATIVE
RBC, UA: NEGATIVE
Specific Gravity, UA: 1.025 (ref 1.005–1.030)
Urobilinogen, Ur: 0.2 mg/dL (ref 0.2–1.0)
pH, UA: 5.5 (ref 5.0–7.5)

## 2019-10-07 LAB — MICROSCOPIC EXAMINATION
Bacteria, UA: NONE SEEN
Epithelial Cells (non renal): NONE SEEN /hpf (ref 0–10)
RBC: NONE SEEN /hpf (ref 0–2)
Renal Epithel, UA: NONE SEEN /hpf

## 2019-10-07 MED ORDER — DOXYCYCLINE HYCLATE 100 MG PO CAPS: 100.0000 mg | ORAL_CAPSULE | Freq: Two times a day (BID) | ORAL | 0 refills | Status: DC

## 2019-10-07 NOTE — Progress Notes (Signed)
Subjective:  Patient ID: Edward Norman, male    DOB: [birth year]  Age: 53 y.o. MRN: [id number]  CC: urinary problem   HPI Edward Norman presents for mild dysuria for several days. Had a girlfriend that he broke up with two months ago. Masturbated into an old sock a week or two ago. Wonders if he picked something up from either situation. He went to Urgent care and test for [facility], chlamydia was obtained. He was given treatment for yeast with a single diflucan.  Concened that symptoms may be nutrition related.  Depression screen [facility] 2/9 10/07/2019 05/29/2018 09/12/2017  Decreased Interest 0 0 0  Down, Depressed, Hopeless 0 0 0  PHQ - 2 Score 0 0 0    History Shubh has a past medical history of Arthritis, GERD (gastroesophageal reflux disease), and Throat infection.   He has a past surgical history that includes NM PET  LYMPHOMA INITIAL.   His family history includes Asthma in his daughter; Cancer in his maternal grandfather.He reports that he has been smoking cigarettes. He has been smoking about 0.50 packs per day. He has never used smokeless tobacco. He reports current alcohol use of about 4.0 standard drinks of alcohol per week. He reports that he does not use drugs.    ROS Review of Systems  Constitutional: Negative for fever.  Respiratory: Negative for shortness of breath.   Cardiovascular: Negative for chest pain.  Genitourinary: Positive for dysuria, frequency and urgency. Negative for decreased urine volume, discharge, flank pain, penile pain, penile swelling and testicular pain.  Musculoskeletal: Negative for arthralgias.  Skin: Negative for rash.    Objective:  BP (!) 175/126   Pulse 74   Temp 97.8 F (36.6 C) (Temporal)   Ht 5\' 11"  (1.803 m)   Wt 201 lb (91.2 kg)   SpO2 97%   BMI 28.03 kg/m   BP Readings from Last 3 Encounters:  10/07/19 (!) 175/126  05/29/18 130/72  09/12/17 130/80    Wt Readings from Last 3 Encounters:  10/07/19 201 lb  (91.2 kg)  05/29/18 189 lb 6 oz (85.9 kg)  09/12/17 198 lb (89.8 kg)     Physical Exam Vitals reviewed.  Constitutional:      Appearance: He is well-developed.  HENT:     Head: Normocephalic and atraumatic.     Right Ear: External ear normal.     Left Ear: External ear normal.     Mouth/Throat:     Pharynx: No oropharyngeal exudate or posterior oropharyngeal erythema.  Eyes:     Pupils: Pupils are equal, round, and reactive to light.  Cardiovascular:     Rate and Rhythm: Normal rate and regular rhythm.     Heart sounds: No murmur.  Pulmonary:     Effort: No respiratory distress.     Breath sounds: Normal breath sounds.  Musculoskeletal:     Cervical back: Normal range of motion and neck supple.  Neurological:     Mental Status: He is alert and oriented to person, place, and time.       Assessment & Plan:   Larue was seen today for urinary problem.  Diagnoses and all orders for this visit:  Acute prostatitis -     urinalysis- dip and micro  Other orders -     Discontinue: doxycycline (VIBRAMYCIN) 100 MG capsule; Take 1 capsule (100 mg total) by mouth 2 (two) times daily. -     doxycycline (VIBRAMYCIN) 100 MG capsule; Take 1 capsule (100 mg total)  by mouth 2 (two) times daily. -     Microscopic Examination       I have discontinued Harrell Gave Handlin's ciprofloxacin and metroNIDAZOLE. I am also having him maintain his sildenafil, albuterol, sildenafil, and doxycycline.  Allergies as of 10/07/2019   No Known Allergies     Medication List       Accurate as of October 07, 2019  7:08 PM. If you have any questions, ask your nurse or doctor.        STOP taking these medications   ciprofloxacin 500 MG tablet Commonly known as: Cipro Stopped by: Claretta Fraise, MD   metroNIDAZOLE 500 MG tablet Commonly known as: FLAGYL Stopped by: Claretta Fraise, MD     TAKE these medications   doxycycline 100 MG capsule Commonly known as: Vibramycin Take 1  capsule (100 mg total) by mouth 2 (two) times daily. Started by: Claretta Fraise, MD   Proventil HFA 108 [phone] Base) MCG/ACT inhaler Generic drug: albuterol Inhale into the lungs.   sildenafil 100 MG tablet Commonly known as: VIAGRA Take 100 mg by mouth.   sildenafil 20 MG tablet Commonly known as: REVATIO TAKE 2 TABLETS BY MOUTH AS NEEDED        Follow-up: No follow-ups on file.  Claretta Fraise, M.D.

## 2020-01-19 ENCOUNTER — Other Ambulatory Visit: Payer: Self-pay

## 2020-01-19 ENCOUNTER — Encounter (HOSPITAL_BASED_OUTPATIENT_CLINIC_OR_DEPARTMENT_OTHER): Payer: Self-pay | Admitting: Emergency Medicine

## 2020-01-19 ENCOUNTER — Emergency Department (HOSPITAL_BASED_OUTPATIENT_CLINIC_OR_DEPARTMENT_OTHER)
Admission: EM | Admit: 2020-01-19 | Discharge: 2020-01-19 | Disposition: A | Discharge status: Home / Self Care | Payer: 59 | Attending: Emergency Medicine | Admitting: Emergency Medicine

## 2020-01-19 DIAGNOSIS — T148XXA Other injury of unspecified body region, initial encounter: Secondary | ICD-10-CM

## 2020-01-19 DIAGNOSIS — Y999 Unspecified external cause status: Secondary | ICD-10-CM | POA: Insufficient documentation

## 2020-01-19 DIAGNOSIS — F17210 Nicotine dependence, cigarettes, uncomplicated: Secondary | ICD-10-CM | POA: Insufficient documentation

## 2020-01-19 DIAGNOSIS — Y939 Activity, unspecified: Secondary | ICD-10-CM | POA: Diagnosis not present

## 2020-01-19 DIAGNOSIS — Y929 Unspecified place or not applicable: Secondary | ICD-10-CM | POA: Insufficient documentation

## 2020-01-19 DIAGNOSIS — Z79899 Other long term (current) drug therapy: Secondary | ICD-10-CM | POA: Diagnosis not present

## 2020-01-19 DIAGNOSIS — S60450A Superficial foreign body of right index finger, initial encounter: Secondary | ICD-10-CM | POA: Insufficient documentation

## 2020-01-19 DIAGNOSIS — W458XXA Other foreign body or object entering through skin, initial encounter: Secondary | ICD-10-CM | POA: Insufficient documentation

## 2020-01-19 MED ORDER — DOXYCYCLINE HYCLATE 100 MG PO CAPS: 100.0000 mg | ORAL_CAPSULE | Freq: Two times a day (BID) | ORAL | 0 refills | Status: DC

## 2020-01-19 NOTE — Discharge Instructions (Addendum)
You did not want any procedures that included needles in the emergency department including updating your tetanus.  I have started on antibiotics.  Please continue to place warm compress.  You may follow-up with hand surgery

## 2020-01-19 NOTE — ED Triage Notes (Signed)
Wood splinter yesterday in right index finger.  Took a knife to it twice yesterday but could not find it.  Got pus out of it today.

## 2020-01-19 NOTE — ED Notes (Signed)
Patient refused exit vitals. Stated he has a "date" he needed to get to.

## 2020-01-19 NOTE — ED Provider Notes (Signed)
[facility] EMERGENCY DEPARTMENT Provider Note   CSN: [id number] Arrival date & time: 01/19/20  1817    History Chief Complaint  Patient presents with  . Foreign Body in [place] is a 54 y.o. male with past medical history significant for GERD who presents for evaluation of splinter to finger. Occurred yesterday. Work with wood products. Has tried "fishing" it out with a knife at home. Mild pain to area. States he had drainage from this last  Night however none tonight. Unknown last tetanus. Denies fever chills, redness, swelling, warmth, paresthesias, decreased ROM to extremities. Denies additional aggrivating or alleviating factors. Rates pain a 3/10.    History obtained from patient and past medical records. No interpretor was used.  HPI     Past Medical History:  Diagnosis Date  . Arthritis   . GERD (gastroesophageal reflux disease)   . Throat infection     There are no problems to display for this patient.   Past Surgical History:  Procedure Laterality Date  . NM PET  LYMPHOMA INITIAL         Family History  Problem Relation Age of Onset  . Asthma Daughter   . Cancer Maternal Grandfather     Social History   Tobacco Use  . Smoking status: Current Every Day Smoker    Packs/day: 1.50    Types: Cigarettes  . Smokeless tobacco: Never Used  Substance Use Topics  . Alcohol use: Yes    Alcohol/week: 4.0 standard drinks    Types: 4 Glasses of wine per week    Comment: Wine or beer daily  . Drug use: No    Home Medications Prior to Admission medications   Medication Sig Start Date End Date Taking? Authorizing Provider  albuterol (VENTOLIN HFA) 108 (90 Base) MCG/ACT inhaler Inhale into the lungs. 10/08/18  Yes [provider]  albuterol (PROVENTIL HFA) 108 (90 Base) MCG/ACT inhaler Inhale into the lungs. 07/08/17 10/07/19  [provider]  doxycycline (VIBRAMYCIN) 100 MG capsule Take 1 capsule (100 mg total) by  mouth 2 (two) times daily. 01/19/20   Kentrel Clevenger A, PA-C  sildenafil (REVATIO) 20 MG tablet TAKE 2 TABLETS BY MOUTH AS NEEDED 01/13/19   Claretta Fraise, MD  sildenafil (VIAGRA) 100 MG tablet Take 100 mg by mouth. 07/18/17 07/18/18  [provider]    Allergies    Patient has no known allergies.  Review of Systems   Review of Systems  Constitutional: Negative.   HENT: Negative.   Eyes: Negative.   Respiratory: Negative.   Cardiovascular: Negative.   Gastrointestinal: Negative.   Genitourinary: Negative.   Musculoskeletal: Negative.   Skin: Positive for wound.  Neurological: Negative.   All other systems reviewed and are negative.   Physical Exam Updated Vital Signs BP (!) 151/103 (BP Location: Right Arm)   Pulse 74   Temp 99.4 F (37.4 C) (Oral)   Resp 16   Ht 5\' 11"  (1.803 m)   Wt 92.5 kg   SpO2 96%   BMI 28.44 kg/m   Physical Exam Vitals and nursing note reviewed.  Constitutional:      General: He is not in acute distress.    Appearance: He is well-developed. He is not ill-appearing, toxic-appearing or diaphoretic.  HENT:     Head: Normocephalic and atraumatic.     Nose: Nose normal.     Mouth/Throat:     Mouth: Mucous membranes are moist.     Pharynx: Oropharynx is  clear.  Eyes:     Pupils: Pupils are equal, round, and reactive to light.  Cardiovascular:     Rate and Rhythm: Normal rate and regular rhythm.     Pulses: Normal pulses.     Heart sounds: Normal heart sounds.  Pulmonary:     Effort: Pulmonary effort is normal. No respiratory distress.     Breath sounds: Normal breath sounds.  Abdominal:     General: Bowel sounds are normal. There is no distension.     Palpations: Abdomen is soft.  Musculoskeletal:        General: Signs of injury present. No swelling, tenderness or deformity. Normal range of motion.       Hands:     Cervical back: Normal range of motion and neck supple.     Right lower leg: No edema.     Comments: No bony  tenderness. Compartments soft. Splinter to right index finder at distal aspect. Non tender flexor tender.  Skin:    General: Skin is warm and dry.     Capillary Refill: Capillary refill takes less than 2 seconds.     Comments: 21mm splinter to distal aspect to right index finger, Mild dried blood under skin. No fluctuance or induration. NO erythema, warmth. No fusiform swelling to digit.  Neurological:     Mental Status: He is alert.    ED Results / Procedures / Treatments   Labs (all labs ordered are listed, but only abnormal results are displayed) Labs Reviewed - No data to display  EKG None  Radiology No results found.  Procedures Procedures (including critical care time)  Medications Ordered in ED Medications - No data to display  ED Course  I have reviewed the triage vital signs and the nursing notes.  Pertinent labs & imaging results that were available during my care of the patient were reviewed by me and considered in my medical decision making (see chart for details).  64 old male appears otherwise well presents for evaluation of splinter to right index finger which occurred yesterday.  Unknown last tetanus.  Had tried using a knife to lacerate the skin to pull this out.  He did have partial removal.  Patient with palpable splinter to distal aspect of finger.  There is some dried blood underneath epidermis however does not appear actively infected.  No drainage or bleeding.  No fluctuance or induration.  No fusiform swelling to digit or tenderness over flexor tendon to suggest flexor tenosynovitis.    Refuses tetanus vaccine at this time as well as any attempt at removal given this requires needles.  Discussed with patient risk versus benefit of not updating his tetanus and removal.  He voiced understanding however continues to decline any care that would require a needle.  Will have him place warm compress, start antibiotics and he may follow-up outpatient with hand surgery  if the splinter does not migrate out of his skin in a reasonable about of time given does not want removal here in the ED.   The patient has been appropriately medically screened and/or stabilized in the ED. I have low suspicion for any other emergent medical condition which would require further screening, evaluation or treatment in the ED or require inpatient management.  Patient is hemodynamically stable and in no acute distress.  Patient able to ambulate in department prior to ED.  Evaluation does not show acute pathology that would require ongoing or additional emergent interventions while in the emergency department or further inpatient  treatment.  I have discussed the diagnosis with the patient and answered all questions.  Pain is been managed while in the emergency department and patient has no further complaints prior to discharge.  Patient is comfortable with plan discussed in room and is stable for discharge at this time.  I have discussed strict return precautions for returning to the emergency department.  Patient was encouraged to follow-up with PCP/specialist refer to at discharge.    MDM Rules/Calculators/A&P                       Final Clinical Impression(s) / ED Diagnoses Final diagnoses:  Splinter    Rx / DC Orders ED Discharge Orders         Ordered    doxycycline (VIBRAMYCIN) 100 MG capsule  2 times daily     01/19/20 2034           Gwynn Crossley A, PA-C 01/19/20 2036    Lennice Sites, DO 01/19/20 2153

## 2020-02-23 ENCOUNTER — Telehealth: Payer: Self-pay | Admitting: Family Medicine

## 2020-02-23 NOTE — Telephone Encounter (Signed)
  Prescription Request  02/23/2020  What is the name of the medication or equipment? Pt wants inhaler and prednizone refilled. He said he has had walking pneumonia several times and dr stacks  Have you contacted your pharmacy to request a refill? (if applicable) no  Which pharmacy would you like this sent to? [facility]   Patient notified that their request is being sent to the clinical staff for review and that they should receive a response within 2 business days.

## 2020-02-23 NOTE — Telephone Encounter (Signed)
Appointment scheduled.

## 2020-03-05 ENCOUNTER — Ambulatory Visit: Payer: PRIVATE HEALTH INSURANCE | Admitting: Family Medicine

## 2020-03-05 ENCOUNTER — Other Ambulatory Visit: Payer: Self-pay

## 2020-03-05 ENCOUNTER — Encounter: Payer: Self-pay | Admitting: Family Medicine

## 2020-03-05 VITALS — BP 136/87 | HR 90 | Temp 97.9°F | Resp 20 | Ht 71.0 in | Wt 203.0 lb

## 2020-03-05 DIAGNOSIS — J301 Allergic rhinitis due to pollen: Secondary | ICD-10-CM | POA: Diagnosis not present

## 2020-03-05 MED ORDER — PREDNISONE 10 MG PO TABS: ORAL_TABLET | ORAL | 0 refills | Status: DC

## 2020-03-05 MED ORDER — ALBUTEROL SULFATE HFA 108 (90 BASE) MCG/ACT IN AERS: 2.0000 | INHALATION_SPRAY | RESPIRATORY_TRACT | 11 refills | Status: DC | PRN

## 2020-03-05 MED ORDER — SILDENAFIL CITRATE 20 MG PO TABS: ORAL_TABLET | ORAL | 4 refills | Status: DC

## 2020-03-05 NOTE — Progress Notes (Signed)
Subjective:  Patient ID: Edward Norman, male    DOB: 1966/08/29  Age: 54 y.o. MRN: [id number]  CC: Medical Management of Chronic Issues (Wants to restart prednisone)   HPI Zavon Gausman presents for patient has allergic rhinitis symptoms including sneezing frequently sniffling, clear rhinorrhea, watery and itchy eyes. There has been no fever no chills no sweats. No earaches. There is some scratchy throat but no sore throat or difficulty swallowing. There is some nasal congestion.   Depression screen [facility] 2/9 03/05/2020 10/07/2019 05/29/2018  Decreased Interest 0 0 0  Down, Depressed, Hopeless 0 0 0  PHQ - 2 Score 0 0 0    History Oziel has a past medical history of Arthritis, GERD (gastroesophageal reflux disease), and Throat infection.   He has a past surgical history that includes NM PET  LYMPHOMA INITIAL.   His family history includes Asthma in his daughter; Cancer in his maternal grandfather.He reports that he has been smoking cigarettes. He has been smoking about 1.50 packs per day. He has never used smokeless tobacco. He reports current alcohol use of about 4.0 standard drinks of alcohol per week. He reports that he does not use drugs.    ROS Review of Systems  Constitutional: Negative for activity change, appetite change, chills and fever.  HENT: Positive for congestion, postnasal drip, rhinorrhea and sinus pressure. Negative for ear discharge, ear pain, hearing loss, nosebleeds, sneezing and trouble swallowing.   Respiratory: Negative for chest tightness and shortness of breath.   Cardiovascular: Negative for chest pain and palpitations.  Skin: Negative for rash.    Objective:  BP 136/87   Pulse 90   Temp 97.9 F (36.6 C) (Temporal)   Resp 20   Ht 5\' 11"  (1.803 m)   Wt 203 lb (92.1 kg)   SpO2 93%   BMI 28.31 kg/m   BP Readings from Last 3 Encounters:  03/05/20 136/87  01/19/20 (!) 151/103  10/07/19 (!) 175/126    Wt Readings from Last 3  Encounters:  03/05/20 203 lb (92.1 kg)  01/19/20 203 lb 14.4 oz (92.5 kg)  10/07/19 201 lb (91.2 kg)     Physical Exam Constitutional:      Appearance: He is well-developed.  HENT:     Head: Normocephalic and atraumatic.     Right Ear: Tympanic membrane and external ear normal. No decreased hearing noted.     Left Ear: Tympanic membrane and external ear normal. No decreased hearing noted.     Nose: Mucosal edema present.     Right Sinus: No frontal sinus tenderness.     Left Sinus: No frontal sinus tenderness.     Mouth/Throat:     Pharynx: No oropharyngeal exudate or posterior oropharyngeal erythema.  Neck:     Meningeal: Brudzinski's sign absent.  Pulmonary:     Effort: No respiratory distress.     Breath sounds: Normal breath sounds.  Lymphadenopathy:     Head:     Right side of head: No preauricular adenopathy.     Left side of head: No preauricular adenopathy.     Cervical:     Right cervical: No superficial cervical adenopathy.    Left cervical: No superficial cervical adenopathy.       Assessment & Plan:   Dallin was seen today for medical management of chronic issues.  Diagnoses and all orders for this visit:  Seasonal allergic rhinitis due to pollen  Other orders -     albuterol (PROVENTIL HFA) 108 (90 Base)  MCG/ACT inhaler; Inhale 2 puffs into the lungs every 4 (four) hours as needed for wheezing or shortness of breath. -     sildenafil (REVATIO) 20 MG tablet; TAKE 2 - 5 TABLETS BY MOUTH AS NEEDED -     predniSONE (DELTASONE) 10 MG tablet; Take 5 daily for 3 days followed by 4,3,2 and 1 for 3 days each.       I have discontinued Harrell Gave Ditton's sildenafil, albuterol, and doxycycline. I have also changed his albuterol and sildenafil. Additionally, I am having him start on predniSONE.  Allergies as of 03/05/2020   No Known Allergies     Medication List       Accurate as of Mar 05, 2020 11:59 PM. If you have any questions, ask your  nurse or doctor.        STOP taking these medications   doxycycline 100 MG capsule Commonly known as: VIBRAMYCIN Stopped by: Claretta Fraise, MD   sildenafil 100 MG tablet Commonly known as: VIAGRA Stopped by: Claretta Fraise, MD     TAKE these medications   albuterol 108 (90 Base) MCG/ACT inhaler Commonly known as: Proventil HFA Inhale 2 puffs into the lungs every 4 (four) hours as needed for wheezing or shortness of breath. What changed:   how much to take  when to take this  reasons to take this  Another medication with the same name was removed. Continue taking this medication, and follow the directions you see here. Changed by: Claretta Fraise, MD   predniSONE 10 MG tablet Commonly known as: DELTASONE Take 5 daily for 3 days followed by 4,3,2 and 1 for 3 days each. Started by: Claretta Fraise, MD   sildenafil 20 MG tablet Commonly known as: REVATIO TAKE 2 - 5 TABLETS BY MOUTH AS NEEDED What changed: additional instructions Changed by: Claretta Fraise, MD        Follow-up: Return if symptoms worsen or fail to improve.  Claretta Fraise, M.D.

## 2020-03-07 ENCOUNTER — Encounter: Payer: Self-pay | Admitting: Family Medicine

## 2020-03-08 ENCOUNTER — Telehealth: Payer: Self-pay

## 2020-03-08 NOTE — Telephone Encounter (Signed)
Patient is requesting a dermatology referral to [facility] for a lipoma on his left arm. He states that he has had several removed before. Please place order for referral

## 2020-03-09 ENCOUNTER — Other Ambulatory Visit: Payer: Self-pay | Admitting: Family Medicine

## 2020-03-09 DIAGNOSIS — D1722 Benign lipomatous neoplasm of skin and subcutaneous tissue of left arm: Secondary | ICD-10-CM

## 2020-03-30 ENCOUNTER — Other Ambulatory Visit: Payer: Self-pay | Admitting: Family Medicine

## 2020-03-30 DIAGNOSIS — D1722 Benign lipomatous neoplasm of skin and subcutaneous tissue of left arm: Secondary | ICD-10-CM

## 2020-03-30 NOTE — Telephone Encounter (Signed)
No answer, no voicemail.

## 2020-03-30 NOTE — Telephone Encounter (Signed)
I sent in the referral

## 2020-03-30 NOTE — Telephone Encounter (Signed)
Pt calling to check status on referral to dermatology.

## 2020-03-31 NOTE — Telephone Encounter (Signed)
Referral has been started and patient aware. 

## 2020-05-10 ENCOUNTER — Telehealth: Payer: Self-pay | Admitting: Family Medicine

## 2020-05-10 NOTE — Telephone Encounter (Signed)
Thanks

## 2020-10-25 ENCOUNTER — Other Ambulatory Visit: Payer: Self-pay

## 2020-10-25 ENCOUNTER — Ambulatory Visit: Payer: PRIVATE HEALTH INSURANCE | Admitting: Family Medicine

## 2020-11-04 ENCOUNTER — Encounter: Payer: PRIVATE HEALTH INSURANCE | Admitting: Family Medicine

## 2020-11-04 DIAGNOSIS — Z024 Encounter for examination for driving license: Secondary | ICD-10-CM

## 2021-08-10 ENCOUNTER — Other Ambulatory Visit: Payer: Self-pay

## 2021-08-10 ENCOUNTER — Encounter: Payer: Self-pay | Admitting: Family Medicine

## 2021-08-10 ENCOUNTER — Ambulatory Visit: Payer: No Typology Code available for payment source | Admitting: Family Medicine

## 2021-08-10 VITALS — BP 104/65 | HR 75 | Temp 98.0°F | Ht 71.0 in | Wt 216.0 lb

## 2021-08-10 DIAGNOSIS — Z0001 Encounter for general adult medical examination with abnormal findings: Secondary | ICD-10-CM

## 2021-08-10 DIAGNOSIS — Z125 Encounter for screening for malignant neoplasm of prostate: Secondary | ICD-10-CM | POA: Diagnosis not present

## 2021-08-10 DIAGNOSIS — Z0000 Encounter for general adult medical examination without abnormal findings: Secondary | ICD-10-CM

## 2021-08-10 DIAGNOSIS — F17200 Nicotine dependence, unspecified, uncomplicated: Secondary | ICD-10-CM

## 2021-08-10 DIAGNOSIS — N529 Male erectile dysfunction, unspecified: Secondary | ICD-10-CM | POA: Diagnosis not present

## 2021-08-10 DIAGNOSIS — Z122 Encounter for screening for malignant neoplasm of respiratory organs: Secondary | ICD-10-CM

## 2021-08-10 DIAGNOSIS — Z1211 Encounter for screening for malignant neoplasm of colon: Secondary | ICD-10-CM | POA: Diagnosis not present

## 2021-08-10 MED ORDER — VARENICLINE TARTRATE 1 MG PO TABS: 1.0000 mg | ORAL_TABLET | Freq: Two times a day (BID) | ORAL | 5 refills | Status: DC

## 2021-08-10 MED ORDER — SILDENAFIL CITRATE 20 MG PO TABS: ORAL_TABLET | ORAL | 4 refills | Status: DC

## 2021-08-10 MED ORDER — CHANTIX STARTING MONTH PAK 0.5 MG X 11 & 1 MG X 42 PO TBPK: 1.0000 | ORAL_TABLET | Freq: Two times a day (BID) | ORAL | 0 refills | Status: DC

## 2021-08-10 NOTE — Progress Notes (Signed)
Subjective:  Patient ID: Edward Norman, male    DOB: 22-Jul-1966  Age: 55 y.o. MRN: [id number]  CC: Medication Refill   HPI Edward Norman presents for now working for [employer] with great schedule. Working out again starting three months ago. Still smoking. Smoking a pack a day. Down from two. Has a 50-60 p-y history. Gets bored while driving. Will start smoking. HE has heard that chantix can make him suicidal. Skeptical about use. He is due for CT chest, low dose for lung Ca surveillance as well as colonoscopy.   He still has E.D. at times. Requests refill of sildenafil. Says vision is foggy sometimes for a little while after.   Depression screen [facility] 2/9 08/10/2021 03/05/2020 10/07/2019  Decreased Interest 0 0 0  Down, Depressed, Hopeless 0 0 0  PHQ - 2 Score 0 0 0  . CAuses him to smoke.   History Edward Norman has a past medical history of Arthritis, GERD (gastroesophageal reflux disease), and Throat infection.   He has a past surgical history that includes NM PET  LYMPHOMA INITIAL.   His family history includes Asthma in his daughter; Cancer in his maternal grandfather.He reports that he has been smoking cigarettes. He has been smoking an average of 1.5 packs per day. He has never used smokeless tobacco. He reports current alcohol use of about 4.0 standard drinks per week. He reports that he does not use drugs.    ROS Review of Systems  Constitutional: Negative.  Negative for fever.  HENT: Negative.    Eyes:  Negative for visual disturbance.  Respiratory:  Negative for cough and shortness of breath.   Cardiovascular:  Negative for chest pain and leg swelling.  Gastrointestinal:  Negative for abdominal pain, diarrhea, nausea and vomiting.  Genitourinary:  Negative for difficulty urinating.  Musculoskeletal:  Negative for arthralgias and myalgias.  Skin:  Negative for rash.  Neurological:  Negative for headaches.  Psychiatric/Behavioral:  Negative for sleep  disturbance.    Objective:  BP 104/65   Pulse 75   Temp 98 F (36.7 C)   Ht _0  (1.803 m)   Wt 216 lb (98 kg)   SpO2 96%   BMI 30.13 kg/m   BP Readings from Last 3 Encounters:  08/10/21 104/65  03/05/20 136/87  01/19/20 (!) 151/103    Wt Readings from Last 3 Encounters:  08/10/21 216 lb (98 kg)  03/05/20 203 lb (92.1 kg)  01/19/20 203 lb 14.4 oz (92.5 kg)     Physical Exam Vitals reviewed.  Constitutional:      General: He is not in acute distress.    Appearance: He is well-developed.  HENT:     Head: Normocephalic and atraumatic.     Right Ear: External ear normal.     Left Ear: External ear normal.     Nose: Nose normal.     Mouth/Throat:     Pharynx: No oropharyngeal exudate or posterior oropharyngeal erythema.  Eyes:     Conjunctiva/sclera: Conjunctivae normal.     Pupils: Pupils are equal, round, and reactive to light.  Cardiovascular:     Rate and Rhythm: Normal rate and regular rhythm.     Heart sounds: Normal heart sounds. No murmur heard. Pulmonary:     Effort: Pulmonary effort is normal. No respiratory distress.     Breath sounds: Normal breath sounds. No wheezing or rales.  Abdominal:     Palpations: Abdomen is soft.     Tenderness: There is no abdominal tenderness.  Musculoskeletal:        General: Normal range of motion.     Cervical back: Normal range of motion and neck supple.  Skin:    General: Skin is warm and dry.  Neurological:     Mental Status: He is alert and oriented to person, place, and time.     Deep Tendon Reflexes: Reflexes are normal and symmetric.  Psychiatric:        Behavior: Behavior normal.        Thought Content: Thought content normal.        Judgment: Judgment normal.      Assessment & Plan:   Edward Norman was seen today for medication refill.  Diagnoses and all orders for this visit:  Well adult exam -     CBC with Differential/Platelet -     CMP14+EGFR -     Ambulatory referral to Gastroenterology -      Lipid panel -     CT CHEST LUNG CANCER SCREENING LOW DOSE WO CONTRAST; Future -     PSA, total and free  Erectile dysfunction, unspecified erectile dysfunction type -     sildenafil (REVATIO) 20 MG tablet; TAKE 2 - 5 TABLETS BY MOUTH AS NEEDED  Screening for prostate cancer -     PSA, total and free  Screen for colon cancer -     Ambulatory referral to Gastroenterology  Screening for lung cancer -     CT CHEST LUNG CANCER SCREENING LOW DOSE WO CONTRAST; Future  Encounter for screening for malignant neoplasm of lung in current smoker with 30 pack year history or greater -     CT CHEST LUNG CANCER SCREENING LOW DOSE WO CONTRAST; Future  Tobacco dependence -     varenicline (CHANTIX CONTINUING MONTH PAK) 1 MG tablet; Take 1 tablet (1 mg total) by mouth 2 (two) times daily. -     Varenicline Tartrate, Starter, (CHANTIX STARTING MONTH PAK) 0.5 MG X 11 & 1 MG X 42 TBPK; Take 1 tablet by mouth 2 (two) times daily. Use according to package directions  Other orders -     Discontinue: sildenafil (REVATIO) 20 MG tablet; TAKE 2 - 5 TABLETS BY MOUTH AS NEEDED      I have discontinued Hommer Dinsmore's predniSONE. I am also having him start on varenicline and Chantix Starting Month Pak. Additionally, I am having him maintain his albuterol and sildenafil.  Allergies as of 08/10/2021   No Known Allergies      Medication List        Accurate as of August 10, 2021  3:10 PM. If you have any questions, ask your nurse or doctor.          STOP taking these medications    predniSONE 10 MG tablet Commonly known as: DELTASONE Stopped by: Claretta Fraise, MD       TAKE these medications    albuterol 108 (90 Base) MCG/ACT inhaler Commonly known as: Proventil HFA Inhale 2 puffs into the lungs every 4 (four) hours as needed for wheezing or shortness of breath.   sildenafil 20 MG tablet Commonly known as: REVATIO TAKE 2 - 5 TABLETS BY MOUTH AS NEEDED   varenicline 1 MG  tablet Commonly known as: Chantix Continuing Month Pak Take 1 tablet (1 mg total) by mouth 2 (two) times daily. Started by: Claretta Fraise, MD   Chantix Starting Month Pak 0.5 MG X 11 & 1 MG X 42 Tbpk Generic drug: Varenicline Tartrate (Starter) Take 1  tablet by mouth 2 (two) times daily. Use according to package directions Started by: Claretta Fraise, MD         Follow-up: Return in about 6 months (around 02/08/2022), or if symptoms worsen or fail to improve.  Claretta Fraise, M.D.

## 2021-11-29 ENCOUNTER — Other Ambulatory Visit: Payer: Self-pay | Admitting: Family Medicine

## 2021-12-15 ENCOUNTER — Encounter: Payer: Self-pay | Admitting: Family Medicine

## 2021-12-15 ENCOUNTER — Ambulatory Visit: Payer: No Typology Code available for payment source | Admitting: Family Medicine

## 2022-04-11 ENCOUNTER — Encounter: Payer: Self-pay | Admitting: Family Medicine

## 2022-04-11 ENCOUNTER — Ambulatory Visit (INDEPENDENT_AMBULATORY_CARE_PROVIDER_SITE_OTHER): Payer: No Typology Code available for payment source | Admitting: Family Medicine

## 2022-04-11 VITALS — BP 128/81 | HR 74 | Temp 98.1°F | Ht 71.0 in | Wt 215.4 lb

## 2022-04-11 DIAGNOSIS — N529 Male erectile dysfunction, unspecified: Secondary | ICD-10-CM | POA: Diagnosis not present

## 2022-04-11 DIAGNOSIS — Z1211 Encounter for screening for malignant neoplasm of colon: Secondary | ICD-10-CM | POA: Diagnosis not present

## 2022-04-11 DIAGNOSIS — J432 Centrilobular emphysema: Secondary | ICD-10-CM | POA: Diagnosis not present

## 2022-04-11 MED ORDER — SILDENAFIL CITRATE 20 MG PO TABS: ORAL_TABLET | ORAL | 4 refills | Status: DC

## 2022-04-11 MED ORDER — ALBUTEROL SULFATE HFA 108 (90 BASE) MCG/ACT IN AERS: 2.0000 | INHALATION_SPRAY | RESPIRATORY_TRACT | 11 refills | Status: AC | PRN

## 2022-04-13 ENCOUNTER — Encounter: Payer: Self-pay | Admitting: *Deleted

## 2022-05-15 ENCOUNTER — Other Ambulatory Visit: Payer: Self-pay | Admitting: *Deleted

## 2022-05-15 ENCOUNTER — Other Ambulatory Visit: Payer: No Typology Code available for payment source

## 2022-05-15 DIAGNOSIS — Z13220 Encounter for screening for lipoid disorders: Secondary | ICD-10-CM

## 2022-05-15 DIAGNOSIS — F17200 Nicotine dependence, unspecified, uncomplicated: Secondary | ICD-10-CM

## 2022-05-15 DIAGNOSIS — Z1211 Encounter for screening for malignant neoplasm of colon: Secondary | ICD-10-CM

## 2022-05-16 LAB — CBC WITH DIFFERENTIAL/PLATELET
Basophils Absolute: 0.1 10*3/uL (ref 0.0–0.2)
Basos: 1 %
EOS (ABSOLUTE): 0.2 10*3/uL (ref 0.0–0.4)
Eos: 2 %
Hematocrit: 43.5 % (ref 37.5–51.0)
Hemoglobin: 14.9 g/dL (ref 13.0–17.7)
Immature Grans (Abs): 0 10*3/uL (ref 0.0–0.1)
Immature Granulocytes: 0 %
Lymphocytes Absolute: 3.1 10*3/uL (ref 0.7–3.1)
Lymphs: 33 %
MCH: 31 pg (ref 26.6–33.0)
MCHC: 34.3 g/dL (ref 31.5–35.7)
MCV: 91 fL (ref 79–97)
Monocytes Absolute: 0.6 10*3/uL (ref 0.1–0.9)
Monocytes: 6 %
Neutrophils Absolute: 5.4 10*3/uL (ref 1.4–7.0)
Neutrophils: 58 %
Platelets: 218 10*3/uL (ref 150–450)
RBC: 4.8 x10E6/uL (ref 4.14–5.80)
RDW: 12.2 % (ref 11.6–15.4)
WBC: 9.4 10*3/uL (ref 3.4–10.8)

## 2022-05-16 LAB — LIPID PANEL
Chol/HDL Ratio: 4.2 ratio (ref 0.0–5.0)
Cholesterol, Total: 207 mg/dL — ABNORMAL HIGH (ref 100–199)
HDL: 49 mg/dL (ref >39)
LDL Chol Calc (NIH): 135 mg/dL — ABNORMAL HIGH (ref 0–99)
Triglycerides: 131 mg/dL (ref 0–149)
VLDL Cholesterol Cal: 23 mg/dL (ref 5–40)

## 2022-05-16 LAB — CMP14+EGFR
ALT: 20 IU/L (ref 0–44)
AST: 18 IU/L (ref 0–40)
Albumin/Globulin Ratio: 1.8 (ref 1.2–2.2)
Albumin: 4.2 g/dL (ref 3.8–4.9)
Alkaline Phosphatase: 81 IU/L (ref 44–121)
BUN/Creatinine Ratio: 15 (ref 9–20)
BUN: 20 mg/dL (ref 6–24)
Bilirubin Total: 0.5 mg/dL (ref 0.0–1.2)
CO2: 25 mmol/L (ref 20–29)
Calcium: 9.2 mg/dL (ref 8.7–10.2)
Chloride: 102 mmol/L (ref 96–106)
Creatinine, Ser: 1.32 mg/dL — ABNORMAL HIGH (ref 0.76–1.27)
Globulin, Total: 2.4 g/dL (ref 1.5–4.5)
Glucose: 106 mg/dL — ABNORMAL HIGH (ref 70–99)
Potassium: 4.3 mmol/L (ref 3.5–5.2)
Sodium: 138 mmol/L (ref 134–144)
Total Protein: 6.6 g/dL (ref 6.0–8.5)
eGFR: 64 mL/min/{1.73_m2} (ref >59)

## 2022-05-16 LAB — PSA, TOTAL AND FREE
PSA, Free Pct: 15.6 %
PSA, Free: 0.25 ng/mL
Prostate Specific Ag, Serum: 1.6 ng/mL (ref 0.0–4.0)

## 2022-05-16 NOTE — Progress Notes (Signed)
Hello Rowen,  Your lab result is normal and/or stable.Some minor variations that are not significant are commonly marked abnormal, but do not represent any medical problem for you.  Best regards, Claretta Fraise, M.D.

## 2022-05-21 LAB — TESTOSTERONE,FREE AND TOTAL
Testosterone, Free: 10.2 pg/mL (ref 7.2–24.0)
Testosterone: 572 ng/dL (ref 264–916)

## 2022-06-16 ENCOUNTER — Encounter: Payer: Self-pay | Admitting: Family

## 2022-06-16 ENCOUNTER — Ambulatory Visit (INDEPENDENT_AMBULATORY_CARE_PROVIDER_SITE_OTHER): Payer: Medicaid Other | Admitting: Family

## 2022-06-16 VITALS — BP 122/80 | HR 83 | Temp 97.7°F | Ht 71.0 in | Wt 215.0 lb

## 2022-06-16 DIAGNOSIS — H6981 Other specified disorders of Eustachian tube, right ear: Secondary | ICD-10-CM

## 2022-06-16 DIAGNOSIS — F17200 Nicotine dependence, unspecified, uncomplicated: Secondary | ICD-10-CM | POA: Diagnosis not present

## 2022-06-16 MED ORDER — CETIRIZINE HCL 10 MG PO TABS: 10.0000 mg | ORAL_TABLET | Freq: Every day | ORAL | 11 refills | Status: AC

## 2022-06-16 MED ORDER — PREDNISONE 20 MG PO TABS: 40.0000 mg | ORAL_TABLET | Freq: Every day | ORAL | 0 refills | Status: AC

## 2022-06-16 MED ORDER — FLUTICASONE PROPIONATE 50 MCG/ACT NA SUSP: 2.0000 | Freq: Every day | NASAL | 6 refills | Status: AC

## 2022-06-16 NOTE — Patient Instructions (Signed)
Eustachian Tube Dysfunction  Eustachian tube dysfunction refers to a condition in which a blockage develops in the narrow passage that connects the middle ear to the back of the nose (eustachian tube). The eustachian tube regulates air pressure in the middle ear by letting air move between the ear and nose. It also helps to drain fluid from the middle ear space. Eustachian tube dysfunction can affect one or both ears. When the eustachian tube does not function properly, air pressure, fluid, or both can build up in the middle ear. What are the causes? This condition occurs when the eustachian tube becomes blocked or cannot open normally. Common causes of this condition include: Ear infections. Colds and other infections that affect the nose, mouth, and throat (upper respiratory tract). Allergies. Irritation from cigarette smoke. Irritation from stomach acid coming up into the esophagus (gastroesophageal reflux). The esophagus is the part of the body that moves food from the mouth to the stomach. Sudden changes in air pressure, such as from descending in an airplane or scuba diving. Abnormal growths in the nose or throat, such as: Growths that line the nose (nasal polyps). Abnormal growth of cells (tumors). Enlarged tissue at the back of the throat (adenoids). What increases the risk? You are more likely to develop this condition if: You smoke. You are overweight. You are a child who has: Certain birth defects of the mouth, such as cleft palate. Large tonsils or adenoids. What are the signs or symptoms? Common symptoms of this condition include: A feeling of fullness in the ear. Ear pain. Clicking or popping noises in the ear. Ringing in the ear (tinnitus). Hearing loss. Loss of balance. Dizziness. Symptoms may get worse when the air pressure around you changes, such as when you travel to an area of high elevation, fly on an airplane, or go scuba diving. How is this diagnosed? This  condition may be diagnosed based on: Your symptoms. A physical exam of your ears, nose, and throat. Tests, such as those that measure: The movement of your eardrum. Your hearing (audiometry). How is this treated? Treatment depends on the cause and severity of your condition. In mild cases, you may relieve your symptoms by moving air into your ears. This is called "popping the ears." In more severe cases, or if you have symptoms of fluid in your ears, treatment may include: Medicines to relieve congestion (decongestants). Medicines that treat allergies (antihistamines). Nasal sprays or ear drops that contain medicines that reduce swelling (steroids). A procedure to drain the fluid in your eardrum. In this procedure, a small tube may be placed in the eardrum to: Drain the fluid. Restore the air in the middle ear space. A procedure to insert a balloon device through the nose to inflate the opening of the eustachian tube (balloon dilation). Follow these instructions at home: Lifestyle Do not do any of the following until your health care provider approves: Travel to high altitudes. Fly in airplanes. Work in a pressurized cabin or room. Scuba dive. Do not use any products that contain nicotine or tobacco. These products include cigarettes, chewing tobacco, and vaping devices, such as e-cigarettes. If you need help quitting, ask your health care provider. Keep your ears dry. Wear fitted earplugs during showering and bathing. Dry your ears completely after. General instructions Take over-the-counter and prescription medicines only as told by your health care provider. Use techniques to help pop your ears as recommended by your health care provider. These may include: Chewing gum. Yawning. Frequent, forceful swallowing.   Closing your mouth, holding your nose closed, and gently blowing as if you are trying to blow air out of your nose. Keep all follow-up visits. This is important. Contact a  health care provider if: Your symptoms do not go away after treatment. Your symptoms come back after treatment. You are unable to pop your ears. You have: A fever. Pain in your ear. Pain in your head or neck. Fluid draining from your ear. Your hearing suddenly changes. You become very dizzy. You lose your balance. Get help right away if: You have a sudden, severe increase in any of your symptoms. Summary Eustachian tube dysfunction refers to a condition in which a blockage develops in the eustachian tube. It can be caused by ear infections, allergies, inhaled irritants, or abnormal growths in the nose or throat. Symptoms may include ear pain or fullness, hearing loss, or ringing in the ears. Mild cases are treated with techniques to unblock the ears, such as yawning or chewing gum. More severe cases are treated with medicines or procedures. This information is not intended to replace advice given to you by your health care provider. Make sure you discuss any questions you have with your health care provider. Document Revised: 12/13/2020 Document Reviewed: 12/13/2020 Elsevier Patient Education  2023 Elsevier Inc.  

## 2022-06-16 NOTE — Progress Notes (Signed)
Subjective:    Patient ID: Edward Norman, male    DOB: Jul 11, 1966, 56 y.o.   MRN: [id number]  Chief Complaint  Patient presents with   Ear Problem    Otalgia  There is pain in the right ear. This is a new problem. The current episode started 1 to 4 weeks ago. The problem has been waxing and waning. There has been no fever. The pain is at a severity of 7/10. The pain is mild. Pertinent negatives include no coughing, diarrhea, ear discharge, headaches, hearing loss, rhinorrhea, sore throat or vomiting. He has tried nothing for the symptoms.  Nicotine Dependence Presents for follow-up visit. Symptoms are negative for sore throat. His urge triggers include company of smokers. The symptoms have been stable. He smokes 1 pack of cigarettes per day.      Review of Systems  HENT:  Positive for ear pain. Negative for ear discharge, hearing loss, rhinorrhea and sore throat.   Respiratory:  Negative for cough.   Gastrointestinal:  Negative for diarrhea and vomiting.  Neurological:  Negative for headaches.  All other systems reviewed and are negative.      Objective:   Physical Exam Vitals reviewed.  Constitutional:      General: He is not in acute distress.    Appearance: He is well-developed.  HENT:     Head: Normocephalic.     Right Ear: No drainage, swelling or tenderness. A middle ear effusion is present. Tympanic membrane is not injected.     Left Ear: Tympanic membrane is erythematous.  Eyes:     General:        Right eye: No discharge.        Left eye: No discharge.     Pupils: Pupils are equal, round, and reactive to light.  Neck:     Thyroid: No thyromegaly.  Cardiovascular:     Rate and Rhythm: Normal rate and regular rhythm.     Heart sounds: Normal heart sounds. No murmur heard. Pulmonary:     Effort: Pulmonary effort is normal. No respiratory distress.     Breath sounds: Normal breath sounds. No wheezing.  Abdominal:     General: Bowel sounds are normal.  There is no distension.     Palpations: Abdomen is soft.     Tenderness: There is no abdominal tenderness.  Musculoskeletal:        General: No tenderness. Normal range of motion.     Cervical back: Normal range of motion and neck supple.  Skin:    General: Skin is warm and dry.     Findings: No erythema or rash.  Neurological:     Mental Status: He is alert and oriented to person, place, and time.     Cranial Nerves: No cranial nerve deficit.     Deep Tendon Reflexes: Reflexes are normal and symmetric.  Psychiatric:        Behavior: Behavior normal.        Thought Content: Thought content normal.        Judgment: Judgment normal.       BP 122/80   Pulse 83   Temp 97.7 F (36.5 C) (Temporal)   Ht '5\' 11"'$  (1.803 m)   Wt 215 lb (97.5 kg)   SpO2 94%   BMI 29.99 kg/m      Assessment & Plan:  Edward Norman comes in today with chief complaint of Ear Problem   Diagnosis and orders addressed:  1. Dysfunction of right eustachian tube  Start zyrtec, flonase and decongestant Prednisone as needed Smoking cessation discussed - fluticasone (FLONASE) 50 MCG/ACT nasal spray; Place 2 sprays into both nostrils daily.  Dispense: 16 g; Refill: 6 - cetirizine (ZYRTEC) 10 MG tablet; Take 1 tablet (10 mg total) by mouth daily.  Dispense: 30 tablet; Refill: 11 - predniSONE (DELTASONE) 20 MG tablet; Take 2 tablets (40 mg total) by mouth daily with breakfast for 5 days.  Dispense: 10 tablet; Refill: 0  2. Smoker Smoking cessation discussed    Evelina Dun, FNP

## 2023-02-02 ENCOUNTER — Other Ambulatory Visit: Payer: Self-pay | Admitting: Family Medicine

## 2023-02-02 ENCOUNTER — Telehealth: Payer: Self-pay | Admitting: Family Medicine

## 2023-02-02 MED ORDER — TADALAFIL 20 MG PO TABS: 10.0000 mg | ORAL_TABLET | ORAL | 11 refills | Status: DC | PRN

## 2023-02-02 NOTE — Telephone Encounter (Signed)
Please let the patient know that I sent their prescription to their pharmacy. Thanks, WS 

## 2023-02-06 ENCOUNTER — Telehealth: Payer: Self-pay | Admitting: Family Medicine

## 2023-02-06 ENCOUNTER — Ambulatory Visit: Payer: Self-pay | Admitting: Family Medicine

## 2023-02-06 ENCOUNTER — Encounter: Payer: Self-pay | Admitting: Family Medicine

## 2023-02-06 ENCOUNTER — Other Ambulatory Visit: Payer: Self-pay

## 2023-02-06 VITALS — BP 117/70 | HR 80 | Temp 98.0°F | Ht 71.0 in | Wt 221.6 lb

## 2023-02-06 DIAGNOSIS — N529 Male erectile dysfunction, unspecified: Secondary | ICD-10-CM | POA: Insufficient documentation

## 2023-02-06 DIAGNOSIS — F17200 Nicotine dependence, unspecified, uncomplicated: Secondary | ICD-10-CM

## 2023-02-06 DIAGNOSIS — Z7141 Alcohol abuse counseling and surveillance of alcoholic: Secondary | ICD-10-CM

## 2023-02-06 DIAGNOSIS — F553 Abuse of steroids or hormones: Secondary | ICD-10-CM

## 2023-02-06 DIAGNOSIS — J432 Centrilobular emphysema: Secondary | ICD-10-CM

## 2023-02-06 MED ORDER — TADALAFIL 20 MG PO TABS
10.0000 mg | ORAL_TABLET | ORAL | 11 refills | Status: DC | PRN
Start: 2023-02-06 — End: 2024-05-07

## 2023-02-06 NOTE — Telephone Encounter (Signed)
CIALIS Rx was sent to wrong pharmacy. Needs to be sent to [facility] in [facility].

## 2023-02-06 NOTE — Telephone Encounter (Signed)
Medication has been sent to the correct pharmacy - attempted to call pt went straight to vm - no vm set up

## 2023-02-06 NOTE — Progress Notes (Signed)
Subjective:  Patient ID: Edward Norman, male    DOB: [birth year]  Age: 57 y.o. 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 Off on Mondays and Tuesday. Sleeping 5 hours. Working out  every other day. Using anabolic steroids.  Wants to use tadalafil. Sildenafil not always effective. Last week he had several drinks prior to use of the med.      02/06/2023    9:51 AM 04/11/2022   10:03 AM 08/10/2021    9:01 AM  Depression screen PHQ 2/9  Decreased Interest 0 0 0  Down, Depressed, Hopeless 0 0 0  PHQ - 2 Score 0 0 0    History Edward Norman has a past medical history of Arthritis, GERD (gastroesophageal reflux disease), and Throat infection.   He has a past surgical history that includes NM PET  LYMPHOMA INITIAL.   His family history includes Asthma in his daughter; Cancer in his maternal grandfather.He reports that he has been smoking cigarettes. He has been smoking an average of 1.5 packs per day. He has never used smokeless tobacco. He reports current alcohol use of about 4.0 standard drinks of alcohol per week. He reports that he does not use drugs.    ROS Review of Systems  Constitutional:  Negative for fever.  Respiratory:  Negative for shortness of breath.   Cardiovascular:  Negative for chest pain.  Musculoskeletal:  Negative for arthralgias.  Skin:  Negative for rash.    Objective:  BP 117/70   Pulse 80   Temp 98 F (36.7 C)   Ht  (1.803 m)   Wt 221 lb 9.6 oz (100.5 kg)   SpO2 95%   BMI 30.91 kg/m   BP Readings from Last 3 Encounters:  02/06/23 117/70  06/16/22 122/80  04/11/22 128/81    Wt Readings from Last 3 Encounters:  02/06/23 221 lb 9.6 oz (100.5 kg)  06/16/22 215 lb (97.5 kg)  04/11/22 215 lb 6.4 oz (97.7 kg)     Physical Exam Vitals reviewed.  Constitutional:      Appearance: He is well-developed.  HENT:     Head:  Normocephalic and atraumatic.     Right Ear: External ear normal.     Left Ear: External ear normal.     Mouth/Throat:     Pharynx: No oropharyngeal exudate or posterior oropharyngeal erythema.  Eyes:     Pupils: Pupils are equal, round, and reactive to light.  Cardiovascular:     Rate and Rhythm: Normal rate and regular rhythm.     Heart sounds: No murmur heard. Pulmonary:     Effort: No respiratory distress.     Breath sounds: Normal breath sounds.  Musculoskeletal:     Cervical back: Normal range of motion and neck supple.  Neurological:     Mental Status: He is alert and oriented to person, place, and time.       Assessment & Plan:   Edward Norman was seen today for medication refill.  Diagnoses and all orders for this visit:  Erectile dysfunction, unspecified erectile dysfunction type  Centrilobular emphysema  Anabolic steroid abuse  Current smoker  Alcohol cessation counseling  Pt. Was counseled regarding dangers of use of anabolic steroids. He gets them from a reliable source, he states. He is willing to accept the consequences.  Additionally we discussed strategies to help stop smoking as well as the dangers to his health if he  continues. Pt. Drinks avg 18 beers a week. Used to be much more. He was avised to cut back to maximum of 2 drinks a day.  I am having Edward Norman maintain his albuterol, fluticasone, and cetirizine.  Allergies as of 02/06/2023   No Known Allergies      Medication List        Accurate as of February 06, 2023  6:10 PM. If you have any questions, ask your nurse or doctor.          albuterol 108 (90 Base) MCG/ACT inhaler Commonly known as: Proventil HFA Inhale 2 puffs into the lungs every 4 (four) hours as needed for wheezing or shortness of breath.   cetirizine 10 MG tablet Commonly known as: ZYRTEC Take 1 tablet (10 mg total) by mouth daily.   fluticasone 50 MCG/ACT nasal spray Commonly known as: FLONASE Place 2  sprays into both nostrils daily.   tadalafil 20 MG tablet Commonly known as: CIALIS Take 0.5-1 tablets (10-20 mg total) by mouth every other day as needed for erectile dysfunction.         Follow-up: Return in about 6 months (around 08/08/2023), or if symptoms worsen or fail to improve.  Mechele Claude, M.D.

## 2023-07-23 NOTE — Progress Notes (Unsigned)
   Acute Office Visit  Subjective:     Patient ID: Edward Norman, male    DOB: Apr 03, 1966, 57 y.o.   MRN: [id number]  No chief complaint on file.   HPI  ROS Negative unless indicated in HPI    Objective:    There were no vitals taken for this visit. BP Readings from Last 3 Encounters:  02/06/23 117/70  06/16/22 122/80  04/11/22 128/81   Wt Readings from Last 3 Encounters:  02/06/23 221 lb 9.6 oz (100.5 kg)  06/16/22 215 lb (97.5 kg)  04/11/22 215 lb 6.4 oz (97.7 kg)      Physical Exam  No results found for any visits on 07/24/23.      Assessment & Plan:  There are no diagnoses linked to this encounter. Continue healthy lifestyle choices, including diet (rich in fruits, vegetables, and lean proteins, and low in salt and simple carbohydrates) and exercise (at least 30 minutes of moderate physical activity daily).     The above assessment and management plan was discussed with the patient. The patient verbalized understanding of and has agreed to the management plan. Patient is aware to call the clinic if they develop any new symptoms or if symptoms persist or worsen. Patient is aware when to return to the clinic for a follow-up visit. Patient educated on when it is appropriate to go to the emergency department.  No follow-ups on file.  Arrie Aran Santa Lighter, DNP [facility] [address] [place], [place] [address] [phone]

## 2023-07-24 ENCOUNTER — Encounter: Payer: Self-pay | Admitting: Nurse Practitioner

## 2023-07-24 ENCOUNTER — Ambulatory Visit: Payer: Self-pay | Admitting: Nurse Practitioner

## 2023-07-24 VITALS — BP 133/68 | HR 88 | Temp 97.9°F | Ht 71.0 in | Wt 227.2 lb

## 2023-07-24 DIAGNOSIS — N3001 Acute cystitis with hematuria: Secondary | ICD-10-CM

## 2023-07-24 DIAGNOSIS — R3912 Poor urinary stream: Secondary | ICD-10-CM

## 2023-07-24 DIAGNOSIS — R300 Dysuria: Secondary | ICD-10-CM

## 2023-07-24 DIAGNOSIS — R1030 Lower abdominal pain, unspecified: Secondary | ICD-10-CM

## 2023-07-24 DIAGNOSIS — Z125 Encounter for screening for malignant neoplasm of prostate: Secondary | ICD-10-CM

## 2023-07-24 LAB — URINALYSIS, ROUTINE W REFLEX MICROSCOPIC
Bilirubin, UA: NEGATIVE
Glucose, UA: NEGATIVE
Leukocytes,UA: NEGATIVE
Nitrite, UA: NEGATIVE
Protein,UA: NEGATIVE
RBC, UA: NEGATIVE
Specific Gravity, UA: 1.02 (ref 1.005–1.030)
Urobilinogen, Ur: 0.2 mg/dL (ref 0.2–1.0)
pH, UA: 6 (ref 5.0–7.5)

## 2023-07-24 LAB — MICROSCOPIC EXAMINATION
Bacteria, UA: NONE SEEN
Epithelial Cells (non renal): NONE SEEN /[HPF] (ref 0–10)
RBC, Urine: NONE SEEN /[HPF] (ref 0–2)

## 2023-07-24 MED ORDER — DOXYCYCLINE HYCLATE 100 MG PO CAPS: 100.0000 mg | ORAL_CAPSULE | Freq: Two times a day (BID) | ORAL | 0 refills | Status: AC

## 2023-07-26 LAB — URINE CULTURE

## 2023-08-07 ENCOUNTER — Ambulatory Visit: Payer: Self-pay | Admitting: Family Medicine

## 2024-04-08 ENCOUNTER — Other Ambulatory Visit: Payer: Self-pay | Admitting: Family Medicine

## 2024-04-08 ENCOUNTER — Encounter: Payer: Self-pay | Admitting: Family Medicine

## 2024-04-08 NOTE — Telephone Encounter (Signed)
 Letter mailed

## 2024-04-08 NOTE — Telephone Encounter (Signed)
 Stacks NTBS Last OV 02/06/23 NO RF sent to pharmacy last OV greater than a year

## 2024-04-24 ENCOUNTER — Ambulatory Visit: Payer: Self-pay | Admitting: Family Medicine

## 2024-04-24 ENCOUNTER — Encounter: Payer: Self-pay | Admitting: Family Medicine

## 2024-05-07 ENCOUNTER — Encounter: Payer: Self-pay | Admitting: Family Medicine

## 2024-05-07 ENCOUNTER — Ambulatory Visit (INDEPENDENT_AMBULATORY_CARE_PROVIDER_SITE_OTHER): Payer: Self-pay | Admitting: Family Medicine

## 2024-05-07 VITALS — BP 137/75 | HR 75 | Temp 97.8°F | Ht 71.0 in | Wt 218.0 lb

## 2024-05-07 DIAGNOSIS — J0141 Acute recurrent pansinusitis: Secondary | ICD-10-CM | POA: Diagnosis not present

## 2024-05-07 MED ORDER — AMOXICILLIN-POT CLAVULANATE 875-125 MG PO TABS
1.0000 | ORAL_TABLET | Freq: Two times a day (BID) | ORAL | 0 refills | Status: DC
Start: 2024-05-07 — End: 2024-05-07

## 2024-05-07 MED ORDER — TRAMADOL HCL 50 MG PO TABS: 50.0000 mg | ORAL_TABLET | Freq: Four times a day (QID) | ORAL | 0 refills | Status: AC

## 2024-05-07 MED ORDER — TADALAFIL 20 MG PO TABS: 10.0000 mg | ORAL_TABLET | ORAL | 11 refills | Status: AC | PRN

## 2024-05-07 MED ORDER — PREDNISONE 10 MG PO TABS: ORAL_TABLET | ORAL | 0 refills | Status: AC

## 2024-05-07 MED ORDER — AMOXICILLIN-POT CLAVULANATE 875-125 MG PO TABS: 1.0000 | ORAL_TABLET | Freq: Two times a day (BID) | ORAL | 0 refills | Status: AC

## 2024-05-07 NOTE — Progress Notes (Signed)
 Chief Complaint  Patient presents with   tooth infection    Right side pain. Tooth removed but still having drainage and fowl odor. Removed last month. Infected twice before. Throbbing headache.     HPI  Patient presents today for Symptoms include congestion, facial pain, nasal congestion, non productive cough, post nasal drip and sinus pressure. Right sided facial pain. Bad odor like dead people. There is no fever, chills, or sweats. Onset of symptoms was several weeks ago with a tooth infection. Had one out, Now planning another (left upper 2nd molar)   PMH: Smoking status noted Review of Systems  Objective: BP 137/75   Pulse 75   Temp 97.8 F (36.6 C)   Ht 5' 11 (1.803 m)   Wt 218 lb (98.9 kg)   SpO2 95%   BMI 30.40 kg/m  Gen: NAD, alert, cooperative with exam HEENT: NCAT, EOMI, PERRL there is marked tenderness in the right cheek and forehead area.  There is gum retraction around the left upper second molar in the molar itself has caries.  There is some erythema indicating inflammation of the underlying gum.  Nasal passages are swollen and erythematous. CV: RRR, good S1/S2, no murmur Resp: CTABL, no wheezes, non-labored Abd: SNTND, BS present, no guarding or organomegaly Ext: No edema, warm Neuro: Alert and oriented, No gross deficits  Acute recurrent pansinusitis  Other orders -     predniSONE ; Take 5 daily for 3 days followed by 4,3,2 and 1 for 3 days each.  Dispense: 45 tablet; Refill: 0 -     traMADol  HCl; Take 1 tablet (50 mg total) by mouth 4 (four) times daily for 5 days. 1-2 tablets up to 4 times a day as needed for pain  Dispense: 20 tablet; Refill: 0 -     Tadalafil ; Take 0.5-1 tablets (10-20 mg total) by mouth every other day as needed for erectile dysfunction.  Dispense: 10 tablet; Refill: 11 -     Amoxicillin -Pot Clavulanate; Take 1 tablet by mouth 2 (two) times daily. Take all of this medication  Dispense: 28 tablet; Refill: 0

## 2024-05-26 ENCOUNTER — Other Ambulatory Visit: Payer: Self-pay | Admitting: Family Medicine

## 2024-05-26 NOTE — Telephone Encounter (Unsigned)
 Copied from CRM [id number]. Topic: Clinical - Medication Refill >> May 26, 2024 11:54 AM Antwanette L wrote: Medication: amoxicillin -clavulanate (AUGMENTIN ) 875-125 MG tablet, predniSONE  (DELTASONE ) 10 MG tablet, and tadalafil  (CIALIS ) 20 MG tablet  Has the patient contacted their pharmacy? No  This is the patient's preferred pharmacy:  [id number] - [place],  - [address] [place] [address] Phone: [phone] Fax: [phone]  Is this the correct pharmacy for this prescription? Yes    Has the prescription been filled recently? Yes. Last refill on all meds were on 05/07/24  Is the patient out of the medication? Yes.  Has the patient been seen for an appointment in the last year OR does the patient have an upcoming appointment? Yes. Last ov with Dr. Zollie was on 05/07/24  Can we respond through MyChart? No. Contact the pt by phone [phone]  Agent: Please be advised that Rx refills may take up to 3 business days. We ask that you follow-up with your pharmacy.

## 2024-05-27 NOTE — Telephone Encounter (Signed)
 We do not refill antibiotics or steroids.

## 2024-05-30 ENCOUNTER — Ambulatory Visit: Payer: Self-pay

## 2024-05-30 NOTE — Telephone Encounter (Signed)
 FYI Only or Action Required?: Action required by provider: medication refill request and update on patient condition.  Patient was last seen in primary care on 05/07/2024 by Zollie Lowers, MD.  Called Nurse Triage reporting Dental Pain.  Symptoms began several weeks ago.  Interventions attempted: Prescription medications: augmentin , prednisone  and Rest, hydration, or home remedies.  Symptoms are: gradually improving.  Triage Disposition: Call Dentist When Office is Open  Patient/caregiver understands and will follow disposition?: No, wishes to speak with PCP  Copied from CRM #[id number]. Topic: Clinical - Prescription Issue >> May 30, 2024 12:29 PM Wess RAMAN wrote: Reason for CRM: Patient is out of amoxicillin -clavulanate (AUGMENTIN ) 875-125 MG tablet and predniSONE  (DELTASONE ) 10 MG tablet. I informed him that prescription refills may take up to 3 business days and it has been 4 days.  Callback #: [id number] Reason for Disposition  Toothache present > 24 hours  Answer Assessment - Initial Assessment Questions 1. LOCATION: Which tooth is hurting?  (e.g., right-side/left-side, upper/lower, front/back)     Same-right sided  2. ONSET: When did the toothache start?  (e.g., hours, days)      ongoing 3. SEVERITY: How bad is the toothache?  (Scale 1-10; mild, moderate or severe)     Improving  4. SWELLING: Is there any visible swelling of your face?     Denies  5. OTHER SYMPTOMS: Do you have any other symptoms? (e.g., fever)     Denies    Additional info: 1) Patient called today to inquire about refills requested 4 days ago, refills were denies but patient not informed.   2) Per patient, pcp stated may need another round of abx and prednisone , if not effective then will refer to ENT.   3) Patient reports he completed augmentin  and prednisone  with some improvement to symptoms, he can tell it is working and requesting another script for Augmentin  and prednisone . Please call  patient if unable to fill.  Protocols used: Toothache-A-AH

## 2024-06-01 NOTE — Telephone Encounter (Signed)
Pt. Needs to be seen for this. Thanks, WS 

## 2024-06-02 NOTE — Telephone Encounter (Signed)
 Pt aware of provider feedback and voiced understanding but declined to schedule at this time.

## 2024-06-25 ENCOUNTER — Ambulatory Visit: Admitting: Family Medicine

## 2024-06-26 ENCOUNTER — Encounter: Payer: Self-pay | Admitting: Family Medicine

## 2024-07-22 ENCOUNTER — Other Ambulatory Visit: Payer: Self-pay | Admitting: Nurse Practitioner

## 2024-07-22 DIAGNOSIS — R3912 Poor urinary stream: Secondary | ICD-10-CM
